# Patient Record
Sex: Female | Born: 1975 | Race: Black or African American | Hispanic: No | Marital: Single | Smoking: Never smoker
Health system: Southern US, Community
[De-identification: ages and names within clinical notes are randomized; demographics above are authoritative.]

## PROBLEM LIST (undated history)

## (undated) DIAGNOSIS — Z5189 Encounter for other specified aftercare: Secondary | ICD-10-CM

## (undated) DIAGNOSIS — I10 Essential (primary) hypertension: Secondary | ICD-10-CM

## (undated) DIAGNOSIS — D649 Anemia, unspecified: Secondary | ICD-10-CM

## (undated) HISTORY — DX: Anemia, unspecified: D64.9

## (undated) SURGERY — ESOPHAGOGASTRODUODENOSCOPY (EGD) WITH PROPOFOL
Anesthesia: Monitor Anesthesia Care

---

## 1998-04-30 ENCOUNTER — Ambulatory Visit (HOSPITAL_COMMUNITY): Admission: RE | Admit: 1998-04-30 | Discharge: 1998-04-30 | Payer: Self-pay | Admitting: *Deleted

## 1998-05-15 ENCOUNTER — Inpatient Hospital Stay (HOSPITAL_COMMUNITY): Admission: AD | Admit: 1998-05-15 | Discharge: 1998-05-15 | Payer: Self-pay | Admitting: *Deleted

## 1998-05-16 ENCOUNTER — Inpatient Hospital Stay (HOSPITAL_COMMUNITY): Admission: AD | Admit: 1998-05-16 | Discharge: 1998-05-16 | Payer: Self-pay | Admitting: Obstetrics

## 1998-07-30 ENCOUNTER — Other Ambulatory Visit: Admission: RE | Admit: 1998-07-30 | Discharge: 1998-07-30 | Payer: Self-pay | Admitting: Obstetrics

## 1998-08-20 ENCOUNTER — Ambulatory Visit (HOSPITAL_COMMUNITY): Admission: RE | Admit: 1998-08-20 | Discharge: 1998-08-20 | Payer: Self-pay | Admitting: *Deleted

## 1998-09-27 ENCOUNTER — Inpatient Hospital Stay (HOSPITAL_COMMUNITY): Admission: AD | Admit: 1998-09-27 | Discharge: 1998-09-30 | Payer: Self-pay | Admitting: Obstetrics & Gynecology

## 1998-11-15 ENCOUNTER — Other Ambulatory Visit: Admission: RE | Admit: 1998-11-15 | Discharge: 1998-11-15 | Payer: Self-pay | Admitting: General Surgery

## 1999-01-21 ENCOUNTER — Other Ambulatory Visit: Admission: RE | Admit: 1999-01-21 | Discharge: 1999-01-21 | Payer: Self-pay | Admitting: Obstetrics and Gynecology

## 1999-01-23 ENCOUNTER — Other Ambulatory Visit: Admission: RE | Admit: 1999-01-23 | Discharge: 1999-01-23 | Payer: Self-pay | Admitting: Obstetrics

## 1999-07-15 ENCOUNTER — Other Ambulatory Visit: Admission: RE | Admit: 1999-07-15 | Discharge: 1999-07-15 | Payer: Self-pay | Admitting: *Deleted

## 1999-07-15 ENCOUNTER — Encounter: Admission: RE | Admit: 1999-07-15 | Discharge: 1999-07-15 | Payer: Self-pay | Admitting: Obstetrics & Gynecology

## 1999-07-29 ENCOUNTER — Encounter: Admission: RE | Admit: 1999-07-29 | Discharge: 1999-07-29 | Payer: Self-pay | Admitting: Obstetrics & Gynecology

## 1999-08-03 ENCOUNTER — Inpatient Hospital Stay (HOSPITAL_COMMUNITY): Admission: AD | Admit: 1999-08-03 | Discharge: 1999-08-03 | Payer: Self-pay | Admitting: Obstetrics

## 1999-08-05 ENCOUNTER — Encounter: Admission: RE | Admit: 1999-08-05 | Discharge: 1999-08-05 | Payer: Self-pay | Admitting: Obstetrics & Gynecology

## 2000-03-09 ENCOUNTER — Ambulatory Visit (HOSPITAL_COMMUNITY): Admission: RE | Admit: 2000-03-09 | Discharge: 2000-03-09 | Payer: Self-pay | Admitting: *Deleted

## 2000-04-30 ENCOUNTER — Ambulatory Visit (HOSPITAL_COMMUNITY): Admission: RE | Admit: 2000-04-30 | Discharge: 2000-04-30 | Payer: Self-pay | Admitting: *Deleted

## 2000-06-11 ENCOUNTER — Ambulatory Visit (HOSPITAL_COMMUNITY): Admission: RE | Admit: 2000-06-11 | Discharge: 2000-06-11 | Payer: Self-pay | Admitting: *Deleted

## 2000-07-30 ENCOUNTER — Inpatient Hospital Stay (HOSPITAL_COMMUNITY): Admission: AD | Admit: 2000-07-30 | Discharge: 2000-07-30 | Payer: Self-pay | Admitting: *Deleted

## 2000-08-12 ENCOUNTER — Inpatient Hospital Stay (HOSPITAL_COMMUNITY): Admission: AD | Admit: 2000-08-12 | Discharge: 2000-08-12 | Payer: Self-pay | Admitting: *Deleted

## 2000-08-12 ENCOUNTER — Inpatient Hospital Stay (HOSPITAL_COMMUNITY): Admission: AD | Admit: 2000-08-12 | Discharge: 2000-08-14 | Payer: Self-pay | Admitting: Obstetrics & Gynecology

## 2000-12-29 ENCOUNTER — Emergency Department (HOSPITAL_COMMUNITY): Admission: EM | Admit: 2000-12-29 | Discharge: 2000-12-29 | Payer: Self-pay | Admitting: *Deleted

## 2002-01-30 ENCOUNTER — Encounter: Admission: RE | Admit: 2002-01-30 | Discharge: 2002-01-30 | Payer: Self-pay | Admitting: *Deleted

## 2002-03-10 ENCOUNTER — Emergency Department (HOSPITAL_COMMUNITY): Admission: EM | Admit: 2002-03-10 | Discharge: 2002-03-10 | Payer: Self-pay | Admitting: Emergency Medicine

## 2004-10-03 ENCOUNTER — Emergency Department (HOSPITAL_COMMUNITY): Admission: EM | Admit: 2004-10-03 | Discharge: 2004-10-03 | Payer: Self-pay | Admitting: Family Medicine

## 2006-03-25 ENCOUNTER — Emergency Department (HOSPITAL_COMMUNITY): Admission: AD | Admit: 2006-03-25 | Discharge: 2006-03-25 | Payer: Self-pay | Admitting: Family Medicine

## 2006-10-28 ENCOUNTER — Inpatient Hospital Stay (HOSPITAL_COMMUNITY): Admission: AD | Admit: 2006-10-28 | Discharge: 2006-10-28 | Payer: Self-pay | Admitting: Obstetrics and Gynecology

## 2006-11-20 ENCOUNTER — Inpatient Hospital Stay (HOSPITAL_COMMUNITY): Admission: AD | Admit: 2006-11-20 | Discharge: 2006-11-20 | Payer: Self-pay | Admitting: Obstetrics & Gynecology

## 2006-11-30 ENCOUNTER — Inpatient Hospital Stay (HOSPITAL_COMMUNITY): Admission: AD | Admit: 2006-11-30 | Discharge: 2006-12-03 | Payer: Self-pay | Admitting: Obstetrics and Gynecology

## 2007-07-08 ENCOUNTER — Emergency Department (HOSPITAL_COMMUNITY): Admission: EM | Admit: 2007-07-08 | Discharge: 2007-07-08 | Payer: Self-pay | Admitting: Family Medicine

## 2007-09-04 ENCOUNTER — Emergency Department (HOSPITAL_COMMUNITY): Admission: EM | Admit: 2007-09-04 | Discharge: 2007-09-04 | Payer: Self-pay | Admitting: Emergency Medicine

## 2007-09-06 ENCOUNTER — Emergency Department (HOSPITAL_COMMUNITY): Admission: EM | Admit: 2007-09-06 | Discharge: 2007-09-06 | Payer: Self-pay | Admitting: Family Medicine

## 2008-06-06 ENCOUNTER — Emergency Department (HOSPITAL_COMMUNITY): Admission: EM | Admit: 2008-06-06 | Discharge: 2008-06-06 | Payer: Self-pay | Admitting: Emergency Medicine

## 2008-09-11 ENCOUNTER — Ambulatory Visit (HOSPITAL_COMMUNITY): Admission: RE | Admit: 2008-09-11 | Discharge: 2008-09-11 | Payer: Self-pay | Admitting: Obstetrics

## 2009-01-26 ENCOUNTER — Ambulatory Visit (HOSPITAL_COMMUNITY): Admission: RE | Admit: 2009-01-26 | Discharge: 2009-01-26 | Payer: Self-pay | Admitting: Obstetrics & Gynecology

## 2009-01-27 ENCOUNTER — Inpatient Hospital Stay (HOSPITAL_COMMUNITY): Admission: AD | Admit: 2009-01-27 | Discharge: 2009-01-30 | Payer: Self-pay | Admitting: Obstetrics & Gynecology

## 2009-01-28 ENCOUNTER — Encounter: Payer: Self-pay | Admitting: Obstetrics & Gynecology

## 2009-04-23 ENCOUNTER — Ambulatory Visit (HOSPITAL_COMMUNITY): Admission: RE | Admit: 2009-04-23 | Discharge: 2009-04-23 | Payer: Self-pay | Admitting: Obstetrics & Gynecology

## 2009-06-27 ENCOUNTER — Emergency Department (HOSPITAL_COMMUNITY): Admission: EM | Admit: 2009-06-27 | Discharge: 2009-06-27 | Payer: Self-pay | Admitting: Family Medicine

## 2009-07-09 ENCOUNTER — Emergency Department (HOSPITAL_COMMUNITY): Admission: EM | Admit: 2009-07-09 | Discharge: 2009-07-09 | Payer: Self-pay | Admitting: Family Medicine

## 2009-07-15 ENCOUNTER — Ambulatory Visit: Payer: Self-pay | Admitting: Family Medicine

## 2009-07-29 ENCOUNTER — Ambulatory Visit: Payer: Self-pay | Admitting: Family Medicine

## 2009-08-12 ENCOUNTER — Ambulatory Visit (HOSPITAL_COMMUNITY): Admission: RE | Admit: 2009-08-12 | Discharge: 2009-08-12 | Payer: Self-pay | Admitting: Obstetrics & Gynecology

## 2009-08-12 ENCOUNTER — Ambulatory Visit: Payer: Self-pay | Admitting: Internal Medicine

## 2010-11-06 ENCOUNTER — Encounter: Payer: Self-pay | Admitting: Obstetrics & Gynecology

## 2011-01-23 LAB — CBC
Hemoglobin: 10.5 g/dL — ABNORMAL LOW (ref 12.0–15.0)
MCHC: 32.8 g/dL (ref 30.0–36.0)
MCV: 74.8 fL — ABNORMAL LOW (ref 78.0–100.0)
RDW: 16.5 % — ABNORMAL HIGH (ref 11.5–15.5)

## 2011-01-25 LAB — CBC
HCT: 18.7 % — ABNORMAL LOW (ref 36.0–46.0)
HCT: 32 % — ABNORMAL LOW (ref 36.0–46.0)
Hemoglobin: 10.9 g/dL — ABNORMAL LOW (ref 12.0–15.0)
MCHC: 33.8 g/dL (ref 30.0–36.0)
MCV: 88.2 fL (ref 78.0–100.0)
MCV: 87.9 fL (ref 78.0–100.0)
Platelets: 143 10*3/uL — ABNORMAL LOW (ref 150–400)
Platelets: 160 10*3/uL (ref 150–400)
Platelets: 159 10*3/uL (ref 150–400)
RBC: 2.2 MIL/uL — ABNORMAL LOW (ref 3.87–5.11)
RBC: 3.64 MIL/uL — ABNORMAL LOW (ref 3.87–5.11)
RDW: 13.9 % (ref 11.5–15.5)
WBC: 11.9 10*3/uL — ABNORMAL HIGH (ref 4.0–10.5)
WBC: 13.2 10*3/uL — ABNORMAL HIGH (ref 4.0–10.5)
WBC: 8 10*3/uL (ref 4.0–10.5)

## 2011-01-25 LAB — BASIC METABOLIC PANEL
BUN: 8 mg/dL (ref 6–23)
Calcium: 8.2 mg/dL — ABNORMAL LOW (ref 8.4–10.5)
Chloride: 107 mEq/L (ref 96–112)
Creatinine, Ser: 0.57 mg/dL (ref 0.4–1.2)
GFR calc Af Amer: >60 mL/min (ref >60)
GFR calc non Af Amer: >60 mL/min (ref >60)

## 2011-01-25 LAB — COMPREHENSIVE METABOLIC PANEL
Albumin: 2.6 g/dL — ABNORMAL LOW (ref 3.5–5.2)
Alkaline Phosphatase: 171 U/L — ABNORMAL HIGH (ref 39–117)
BUN: 7 mg/dL (ref 6–23)
CO2: 22 mEq/L (ref 19–32)
Chloride: 105 mEq/L (ref 96–112)
Creatinine, Ser: 0.62 mg/dL (ref 0.4–1.2)
GFR calc non Af Amer: >60 mL/min (ref >60)
Potassium: 4 mEq/L (ref 3.5–5.1)
Total Bilirubin: 0.2 mg/dL — ABNORMAL LOW (ref 0.3–1.2)

## 2011-01-25 LAB — URIC ACID: Uric Acid, Serum: 5.2 mg/dL (ref 2.4–7.0)

## 2011-01-25 LAB — RPR: RPR Ser Ql: NONREACTIVE

## 2011-02-16 ENCOUNTER — Ambulatory Visit: Payer: Self-pay | Admitting: Internal Medicine

## 2011-02-16 DIAGNOSIS — Z0289 Encounter for other administrative examinations: Secondary | ICD-10-CM

## 2011-02-28 NOTE — Op Note (Signed)
NAMEWAYNE, Castillo                  ACCOUNT NO.:  [id number]   MEDICAL RECORD NO.:  [id number]          PATIENT TYPE:  AMB   LOCATION:  SDC                           FACILITY:  [facility]   PHYSICIAN:  Roseanna Rainbow, M.D.DATE OF BIRTH:  [birth year]   DATE OF PROCEDURE:  DATE OF DISCHARGE:  04/23/2009                               OPERATIVE REPORT   PREOPERATIVE DIAGNOSIS:  Multiparity, desires a sterilization procedure.   POSTOPERATIVE DIAGNOSIS:  Multiparity, desires a sterilization  procedure.   PROCEDURE:  Essure procedure.   SURGEON:  Roseanna Rainbow, MD   ANESTHESIA:  Laryngeal mask airway.   FINDINGS:  Upon hysteroscopic survey, there were no lesions noted.  The  endometrium was thin.  After the Essure devices were placed, there were  3 coils distal to the tubal ostia bilaterally.   PATHOLOGY:  None.   ESTIMATED BLOOD LOSS:  Minimal.   COMPLICATIONS:  None.   PROCEDURE:  The patient was taken to the operating room with an IV  running.  A laryngeal mask airway was placed.  She was placed in the  dorsal lithotomy position and prepped and draped in the usual sterile  fashion.  After time-out had been completed, a sterile speculum was  placed in the patient's vagina.  The anterior lip of the cervix was  grasped with a single-tooth tenaculum.  Hysteroscope was then introduced  into the cervix and advanced into the uterus.  The tubal ostia were well  visualized bilaterally.  The right tube was cannulated first.  The  device was placed without difficulty.  The left tube was cannulated and  the device placed in a similar fashion on the left.  All instruments  were then removed.  The single-tooth tenaculum was removed from the  cervix with minimal bleeding noted.  At the close of the procedure, the  instrument and pack counts were said to be correct x2.  The patient was  taken to the PACU awake and in stable condition.      Roseanna Rainbow, M.D.  Electronically Signed     LAJ/MEDQ  D:  04/23/2009  T:  04/24/2009  Job:  [id number]

## 2011-02-28 NOTE — H&P (Signed)
Faith Castillo, Faith Castillo                  ACCOUNT NO.:  [id number]   MEDICAL RECORD NO.:  [id number]          PATIENT TYPE:  INP   LOCATION:  9371                          FACILITY:  [facility]   PHYSICIAN:  Roseanna Rainbow, M.D.DATE OF BIRTH:  1976-04-28   DATE OF ADMISSION:  01/27/2009  DATE OF DISCHARGE:                              HISTORY & PHYSICAL   CHIEF COMPLAINT:  The patient is a 35 year old para 4 with an estimated  date of confinement of February 12, 2009, with an intrauterine pregnancy at  37.5 weeks with elevated blood pressures.   HISTORY OF PRESENT ILLNESS:  The patient does not have any history of  chronic hypertension or pregnancy-induced hypertension.  However, over  the last several weeks, there had been elevated blood pressures noted at  the prenatal visits in the 140/80 range.  She has denied any  neurological symptoms.  She has also not had any significant proteinuria  on her urine dip.  On presentation to the office earlier today, blood  pressures were labile with diastolics in the 100 range.  The urine dip  showed 1+ protein.  A non-stress test was reassuring.  A BPP one day  prior to presentation was 8/8 with a cephalic presentation.  On exam in  the office, she was 2 cm dilated, 60% effaced.  A PIH panel from January 21, 2009, was remarkable for uric acid of 6.1.   ALLERGIES:  No known drug allergies.   MEDICATIONS:  Prenatal vitamins.   OB RISK FACTORS:  Grand multiparity, urinary tract infection.   PRENATAL LABORATORIES:  Chlamydia probe negative.  Urine culture and  sensitivity, insignificant growth.  February 2010 and November 2009  Staphylococcus coagulase negative.  Pap smear negative.  GC probe  negative.  Hepatitis B surface antigen negative.  One-hour GTT 107,  hematocrit 34, hemoglobin 11.  HIV nonreactive.  Platelets 205,000.  Blood type is A+.  Antibody screen negative.  RPR nonreactive.  Rubella  immune.  Sickle cell negative.  GBS negative on January 13, 2009.   PAST OBSTETRICAL HISTORY:  There is a history of 2 voluntary  terminations of pregnancy.  In August 1994, she was delivered of a live  born female full term, 6 pounds 15 ounces, vaginal delivery, no  complications.  In December 1999, she was delivered of a live born female  full term, 7 pounds 7 ounces, vaginal delivery at 30 weeks' gestation.  In October 2001, she was delivered of a live born female full term,  vaginal delivery 8 pounds, no complications.  In February 2008, she was  delivered of a live born female full term, 8 pounds 13 ounces, vaginal  delivery, no complications.   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  She is  status post a LEEP procedure.   PAST MEDICAL HISTORY:  No significant history of medical diseases.   PAST SURGICAL HISTORY:  Please see the above.   REVIEW OF SYSTEMS:  NEUROLOGICAL:  She denies any headache or visual  disturbances.  GI:  She denies any epigastric  pain.  PULMONARY:  She  denies any shortness of breath.   SOCIAL HISTORY:  She is a Lawyer.  She is single living with the  significant other.  Does not give any significant history of alcohol  usage, has no significant smoking history, denies illicit drug use.   FAMILY HISTORY:  Remarkable for hypertension.   PHYSICAL EXAMINATION:  Blood pressures 150s to 170s over 90s to 100s and  110s.  Fetal heart tracing reassuring.  Tocodynamometer, rare uterine  contractions.  Sterile vaginal exam, please see the above.  Extremities,  1+ lower extremity edema.   LABS ON ADMISSION:  Hemoglobin 10.9, platelets 159,000.  Uric acid 5.2,  LDH 232, creatinine 0.62.  SGOT and SGPT 26 and 15 respectively.   ASSESSMENT:  Multipara at 37.5 weeks with a blood pressure criteria for  severe pregnancy-induced hypertension, favorable Bishop score.  Fetal  heart tracing consistent with fetal well being.   PLAN:  This patient was reviewed with Dr. Katrinka Blazing at the [facility].  The  recommendation was to proceed with induction of labor.  In  light of the labile blood pressures, we will start magnesium sulfate for  seizure prophylaxis.  We will also administer labetalol parenterally as  necessary.  We will also start on a p.o. labetalol regimen for blood  pressure control.  We will start with low-dose Pitocin per protocol  along with artificial rupture of membranes.      Roseanna Rainbow, M.D.  Electronically Signed     LAJ/MEDQ  D:  01/27/2009  T:  01/28/2009  Job:  [id number]

## 2011-07-25 LAB — WOUND CULTURE

## 2011-07-27 LAB — POCT URINALYSIS DIP (DEVICE)
Glucose, UA: NEGATIVE
Urobilinogen, UA: 0.2
pH: 6

## 2011-07-27 LAB — POCT PREGNANCY, URINE
Operator id: 116391
Preg Test, Ur: NEGATIVE

## 2011-07-27 LAB — WET PREP, GENITAL
Trich, Wet Prep: NONE SEEN
Yeast Wet Prep HPF POC: NONE SEEN

## 2011-07-27 LAB — GC/CHLAMYDIA PROBE AMP, GENITAL: GC Probe Amp, Genital: NEGATIVE

## 2012-03-25 ENCOUNTER — Other Ambulatory Visit: Payer: Self-pay | Admitting: Occupational Medicine

## 2012-03-25 ENCOUNTER — Ambulatory Visit: Payer: Self-pay

## 2012-03-25 DIAGNOSIS — R52 Pain, unspecified: Secondary | ICD-10-CM

## 2013-07-23 ENCOUNTER — Emergency Department (INDEPENDENT_AMBULATORY_CARE_PROVIDER_SITE_OTHER)
Admission: EM | Admit: 2013-07-23 | Discharge: 2013-07-23 | Disposition: A | Discharge status: Home / Self Care | Payer: Self-pay | Source: Home / Self Care | Attending: Emergency Medicine | Admitting: Emergency Medicine

## 2013-07-23 ENCOUNTER — Other Ambulatory Visit (HOSPITAL_COMMUNITY)
Admission: RE | Admit: 2013-07-23 | Discharge: 2013-07-23 | Disposition: A | Discharge status: Home / Self Care | Payer: Self-pay | Source: Ambulatory Visit | Attending: Emergency Medicine | Admitting: Emergency Medicine

## 2013-07-23 DIAGNOSIS — N760 Acute vaginitis: Secondary | ICD-10-CM | POA: Insufficient documentation

## 2013-07-23 DIAGNOSIS — Z113 Encounter for screening for infections with a predominantly sexual mode of transmission: Secondary | ICD-10-CM | POA: Insufficient documentation

## 2013-07-23 DIAGNOSIS — N949 Unspecified condition associated with female genital organs and menstrual cycle: Secondary | ICD-10-CM

## 2013-07-23 DIAGNOSIS — N938 Other specified abnormal uterine and vaginal bleeding: Secondary | ICD-10-CM

## 2013-07-23 LAB — POCT URINALYSIS DIP (DEVICE)
Bilirubin Urine: NEGATIVE
Nitrite: POSITIVE — AB
Protein, ur: NEGATIVE mg/dL
Urobilinogen, UA: 0.2 mg/dL (ref 0.0–1.0)
pH: 6.5 (ref 5.0–8.0)

## 2013-07-23 LAB — POCT PREGNANCY, URINE: Preg Test, Ur: NEGATIVE

## 2013-07-23 MED ORDER — MEDROXYPROGESTERONE ACETATE 10 MG PO TABS: 10.0000 mg | ORAL_TABLET | Freq: Every day | ORAL | Status: DC

## 2013-07-23 NOTE — ED Provider Notes (Signed)
Chief Complaint:   Chief Complaint  Patient presents with  . Vaginal Bleeding    History of Present Illness:   Faith Castillo is a 37 year old female who comes in today because of a regular vaginal bleeding. She had her normal menses from September 10 2 September 17 she states she has a heavy menstrual flow, but this was nothing unusual for her. She did not have any clotting or cramping. She began to bleed again this past Sunday which was 4 days ago. This was similar to a normal menstrual flow but with clots. She denies any pain or cramping. She's had some headache but denies fever, chills, nausea, or vomiting. She denies any urinary symptoms. She's had no vaginal discharge or itching. She denies any pelvic or lower back pain. The patient is married, she is sexually active, and has a tubal ligation. The patient states she had something very similar a couple of years ago and was given a prescription for Provera which did seem to help the situation.  Review of Systems:  Other than noted above, the patient denies any of the following symptoms: Systemic:  No fever, chills, sweats, or weight loss. GI:  No abdominal pain, nausea, anorexia, vomiting, diarrhea, constipation, melena or hematochezia. GU:  No dysuria, frequency, urgency, hematuria, vaginal discharge, itching, or abnormal vaginal bleeding. Skin:  No rash or itching.  PMFSH:  Past medical history, family history, social history, meds, and allergies were reviewed.  She has high blood pressure and takes medication for that, although she did not take her medication today.  Physical Exam:   Vital signs:  BP 151/101  Pulse 66  Temp(Src) 98.7 F (37.1 C) (Oral)  Resp 16  SpO2 100% General:  Alert, oriented and in no distress. Lungs:  Breath sounds clear and equal bilaterally.  No wheezes, rales or rhonchi. Heart:  Regular rhythm.  No gallops or murmers. Abdomen:  Soft, flat and non-distended.  No organomegaly or mass.  No tenderness, guarding  or rebound.  Bowel sounds normally active. Pelvic exam:  Normal external genitalia, vaginal and cervical mucosa were normal, no vaginal discharge, and only minimal vaginal bleeding. There was no pain on cervical motion. Uterus was midposition, normal in size and shape, and nontender. No adnexal masses or tenderness. DNA probe for gonorrhea, Chlamydia, Trichomonas, Gardnerella, Candida were obtained. Skin:  Clear, warm and dry.  Labs:   Results for orders placed during the hospital encounter of 07/23/13  POCT URINALYSIS DIP (DEVICE)      Result Value Range   Glucose, UA NEGATIVE  NEGATIVE mg/dL   Bilirubin Urine NEGATIVE  NEGATIVE   Ketones, ur NEGATIVE  NEGATIVE mg/dL   Specific Gravity, Urine >=1.030  1.005 - 1.030   Hgb urine dipstick SMALL (*) NEGATIVE   pH 6.5  5.0 - 8.0   Protein, ur NEGATIVE  NEGATIVE mg/dL   Urobilinogen, UA 0.2  0.0 - 1.0 mg/dL   Nitrite POSITIVE (*) NEGATIVE   Leukocytes, UA TRACE (*) NEGATIVE  POCT PREGNANCY, URINE      Result Value Range   Preg Test, Ur NEGATIVE  NEGATIVE     Assessment:  The encounter diagnosis was Dysfunctional uterine bleeding.  No evidence for pregnancy, infection, or tumor as a cause for the bleeding. Most likely due to hormone imbalance. Was provided with a prescription for Provera, and if this is a recurring problem, suggested followup with a gynecologist at the [facility].  Plan:   1.  Meds:  The following  meds were prescribed:   Discharge Medication List as of 07/23/2013  7:15 PM    START taking these medications   Details  medroxyPROGESTERone (PROVERA) 10 MG tablet Take 1 tablet (10 mg total) by mouth daily., Starting 07/23/2013, Until Discontinued, Normal        2.  Patient Education/Counseling:  The patient was given appropriate handouts, self care instructions, and instructed in symptomatic relief.   3.  Follow up:  The patient was told to follow up if no better in 3 to 4 days, if becoming worse in any way,  and given some red flag symptoms such as any degree of pain or heavier bleeding which would prompt immediate return.  Follow up at [facility] if symptoms are recurring.Reuben Likes, MD 07/23/13 [phone]

## 2013-07-23 NOTE — ED Notes (Signed)
C/o vaginal bleeding onset 9/28. LMP 9/10 and lasted 7 days.  C/o slight headache. BP elevated and admits to not taking her BP pill today.

## 2013-07-25 NOTE — ED Notes (Addendum)
GC/Chlamydia neg., Affirm: Candida and Trich neg., Gardnerella pos. Message sent to Dr. Lorenz Coaster.  He wrote that pt. did not have any symptoms so, it does not need to be treated. Faith Castillo 07/25/2013

## 2013-07-26 ENCOUNTER — Encounter (HOSPITAL_COMMUNITY): Payer: Self-pay | Admitting: Emergency Medicine

## 2015-02-26 ENCOUNTER — Encounter (HOSPITAL_COMMUNITY): Payer: Self-pay | Admitting: Family Medicine

## 2015-02-26 ENCOUNTER — Emergency Department (INDEPENDENT_AMBULATORY_CARE_PROVIDER_SITE_OTHER)
Admission: EM | Admit: 2015-02-26 | Discharge: 2015-02-26 | Disposition: A | Discharge status: Home / Self Care | Payer: Self-pay | Source: Home / Self Care | Attending: Family Medicine | Admitting: Family Medicine

## 2015-02-26 ENCOUNTER — Ambulatory Visit (HOSPITAL_COMMUNITY): Payer: No Typology Code available for payment source | Attending: Family Medicine

## 2015-02-26 ENCOUNTER — Ambulatory Visit: Payer: PRIVATE HEALTH INSURANCE

## 2015-02-26 DIAGNOSIS — M25522 Pain in left elbow: Secondary | ICD-10-CM | POA: Diagnosis present

## 2015-02-26 DIAGNOSIS — M25512 Pain in left shoulder: Secondary | ICD-10-CM | POA: Insufficient documentation

## 2015-02-26 DIAGNOSIS — M62838 Other muscle spasm: Secondary | ICD-10-CM

## 2015-02-26 DIAGNOSIS — S40022A Contusion of left upper arm, initial encounter: Secondary | ICD-10-CM

## 2015-02-26 HISTORY — DX: Essential (primary) hypertension: I10

## 2015-02-26 MED ORDER — IBUPROFEN 800 MG PO TABS
800.0000 mg | ORAL_TABLET | Freq: Once | ORAL | Status: AC
  Administered 2015-02-26: 800 mg via ORAL

## 2015-02-26 MED ORDER — METHOCARBAMOL 500 MG PO TABS: 500.0000 mg | ORAL_TABLET | Freq: Four times a day (QID) | ORAL | Status: DC | PRN

## 2015-02-26 MED ORDER — IBUPROFEN 800 MG PO TABS
ORAL_TABLET | ORAL | Status: AC
  Filled 2015-02-26: qty 1

## 2015-02-26 MED ORDER — DICLOFENAC SODIUM 75 MG PO TBEC: 75.0000 mg | DELAYED_RELEASE_TABLET | Freq: Two times a day (BID) | ORAL | Status: DC
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
## 2016-02-24 ENCOUNTER — Emergency Department (HOSPITAL_COMMUNITY)
Admission: EM | Admit: 2016-02-24 | Discharge: 2016-02-25 | Disposition: A | Discharge status: Home / Self Care | Payer: No Typology Code available for payment source | Attending: Emergency Medicine | Admitting: Emergency Medicine

## 2016-02-24 DIAGNOSIS — Z79899 Other long term (current) drug therapy: Secondary | ICD-10-CM | POA: Insufficient documentation

## 2016-02-24 DIAGNOSIS — N760 Acute vaginitis: Secondary | ICD-10-CM | POA: Insufficient documentation

## 2016-02-24 DIAGNOSIS — A599 Trichomoniasis, unspecified: Secondary | ICD-10-CM

## 2016-02-24 DIAGNOSIS — I10 Essential (primary) hypertension: Secondary | ICD-10-CM | POA: Insufficient documentation

## 2016-02-24 DIAGNOSIS — B9689 Other specified bacterial agents as the cause of diseases classified elsewhere: Secondary | ICD-10-CM | POA: Insufficient documentation

## 2016-02-25 ENCOUNTER — Encounter (HOSPITAL_COMMUNITY): Payer: Self-pay | Admitting: Emergency Medicine

## 2016-02-25 LAB — WET PREP, GENITAL
Sperm: NONE SEEN
Yeast Wet Prep HPF POC: NONE SEEN

## 2016-02-25 MED ORDER — ONDANSETRON 4 MG PO TBDP
4.0000 mg | ORAL_TABLET | Freq: Once | ORAL | Status: AC
  Administered 2016-02-25: 4 mg via ORAL
  Filled 2016-02-25: qty 1

## 2016-02-25 MED ORDER — AZITHROMYCIN 250 MG PO TABS
1000.0000 mg | ORAL_TABLET | Freq: Once | ORAL | Status: AC
  Administered 2016-02-25: 1000 mg via ORAL
  Filled 2016-02-25: qty 4

## 2016-02-25 MED ORDER — CEFTRIAXONE SODIUM 250 MG IJ SOLR
250.0000 mg | Freq: Once | INTRAMUSCULAR | Status: AC
  Administered 2016-02-25: 250 mg via INTRAMUSCULAR
  Filled 2016-02-25: qty 250

## 2016-02-25 MED ORDER — STERILE WATER FOR INJECTION IJ SOLN
INTRAMUSCULAR | Status: AC
  Administered 2016-02-25: 10 mL
  Filled 2016-02-25: qty 10

## 2016-02-25 MED ORDER — METRONIDAZOLE 500 MG PO TABS
2000.0000 mg | ORAL_TABLET | Freq: Once | ORAL | Status: AC
  Administered 2016-02-25: 2000 mg via ORAL
  Filled 2016-02-25: qty 4

## 2016-02-25 NOTE — Discharge Instructions (Signed)
Bacterial Vaginosis Bacterial vaginosis is a vaginal infection that occurs when the normal balance of bacteria in the vagina is disrupted. It results from an overgrowth of certain bacteria. This is the most common vaginal infection in women of childbearing age. Treatment is important to prevent complications, especially in pregnant women, as it can cause a premature delivery. CAUSES  Bacterial vaginosis is caused by an increase in harmful bacteria that are normally present in smaller amounts in the vagina. Several different kinds of bacteria can cause bacterial vaginosis. However, the reason that the condition develops is not fully understood. RISK FACTORS Certain activities or behaviors can put you at an increased risk of developing bacterial vaginosis, including:  Having a new sex partner or multiple sex partners.  Douching.  Using an intrauterine device (IUD) for contraception. Women do not get bacterial vaginosis from toilet seats, bedding, swimming pools, or contact with objects around them. SIGNS AND SYMPTOMS  Some women with bacterial vaginosis have no signs or symptoms. Common symptoms include:  Grey vaginal discharge.  A fishlike odor with discharge, especially after sexual intercourse.  Itching or burning of the vagina and vulva.  Burning or pain with urination. DIAGNOSIS  Your health care provider will take a medical history and examine the vagina for signs of bacterial vaginosis. A sample of vaginal fluid may be taken. Your health care provider will look at this sample under a microscope to check for bacteria and abnormal cells. A vaginal pH test may also be done.  TREATMENT  Bacterial vaginosis may be treated with antibiotic medicines. These may be given in the form of a pill or a vaginal cream. A second round of antibiotics may be prescribed if the condition comes back after treatment. Because bacterial vaginosis increases your risk for sexually transmitted diseases, getting  treated can help reduce your risk for chlamydia, gonorrhea, HIV, and herpes. HOME CARE INSTRUCTIONS   Only take over-the-counter or prescription medicines as directed by your health care provider.  If antibiotic medicine was prescribed, take it as directed. Make sure you finish it even if you start to feel better.  Tell all sexual partners that you have a vaginal infection. They should see their health care provider and be treated if they have problems, such as a mild rash or itching.  During treatment, it is important that you follow these instructions:  Avoid sexual activity or use condoms correctly.  Do not douche.  Avoid alcohol as directed by your health care provider.  Avoid breastfeeding as directed by your health care provider. SEEK MEDICAL CARE IF:   Your symptoms are not improving after 3 days of treatment.  You have increased discharge or pain.  You have a fever. MAKE SURE YOU:   Understand these instructions.  Will watch your condition.  Will get help right away if you are not doing well or get worse. FOR MORE INFORMATION  Centers for Disease Control and Prevention, Division of STD Prevention: www.cdc.gov/std American Sexual Health Association (ASHA): www.ashastd.org    This information is not intended to replace advice given to you by your health care provider. Make sure you discuss any questions you have with your health care provider.   Document Released: 10/02/2005 Document Revised: 10/23/2014 Document Reviewed: 05/14/2013 Elsevier Interactive Patient Education 2016 Elsevier Inc. Trichomoniasis Trichomoniasis is an infection caused by an organism called Trichomonas. The infection can affect both women and men. In women, the outer female genitalia and the vagina are affected. In men, the penis is mainly affected,   but the prostate and other reproductive organs can also be involved. Trichomoniasis is a sexually transmitted infection (STI) and is most often  passed to another person through sexual contact.  RISK FACTORS  Having unprotected sexual intercourse.  Having sexual intercourse with an infected partner. SIGNS AND SYMPTOMS  Symptoms of trichomoniasis in women include:  Abnormal gray-green frothy vaginal discharge.  Itching and irritation of the vagina.  Itching and irritation of the area outside the vagina. Symptoms of trichomoniasis in men include:   Penile discharge with or without pain.  Pain during urination. This results from inflammation of the urethra. DIAGNOSIS  Trichomoniasis may be found during a Pap test or physical exam. Your health care provider may use one of the following methods to help diagnose this infection:  Testing the pH of the vagina with a test tape.  Using a vaginal swab test that checks for the Trichomonas organism. A test is available that provides results within a few minutes.  Examining a urine sample.  Testing vaginal secretions. Your health care provider may test you for other STIs, including HIV. TREATMENT   You may be given medicine to fight the infection. Women should inform their health care provider if they could be or are pregnant. Some medicines used to treat the infection should not be taken during pregnancy.  Your health care provider may recommend over-the-counter medicines or creams to decrease itching or irritation.  Your sexual partner will need to be treated if infected.  Your health care provider may test you for infection again 3 months after treatment. HOME CARE INSTRUCTIONS   Take medicines only as directed by your health care provider.  Take over-the-counter medicine for itching or irritation as directed by your health care provider.  Do not have sexual intercourse while you have the infection.  Women should not douche or wear tampons while they have the infection.  Discuss your infection with your partner. Your partner may have gotten the infection from you, or you  may have gotten it from your partner.  Have your sex partner get examined and treated if necessary.  Practice safe, informed, and protected sex.  See your health care provider for other STI testing. SEEK MEDICAL CARE IF:   You still have symptoms after you finish your medicine.  You develop abdominal pain.  You have pain when you urinate.  You have bleeding after sexual intercourse.  You develop a rash.  Your medicine makes you sick or makes you throw up (vomit). MAKE SURE YOU:  Understand these instructions.  Will watch your condition.  Will get help right away if you are not doing well or get worse.   This information is not intended to replace advice given to you by your health care provider. Make sure you discuss any questions you have with your health care provider.   Document Released: 03/28/2001 Document Revised: 10/23/2014 Document Reviewed: 07/14/2013 Elsevier Interactive Patient Education 2016 Elsevier Inc.  

## 2016-02-25 NOTE — ED Notes (Signed)
Cart at the bedside.

## 2016-02-25 NOTE — ED Notes (Signed)
Pt states she has a really bad yeast infection for the past two weeks  Pt states she has been using monostat combo pack without relief  Pt states she has clear to white vaginal discharge with irritation and itching

## 2016-02-25 NOTE — ED Provider Notes (Signed)
CSN: [id number]     Arrival date & time 02/24/16  2358 History   First MD Initiated Contact with Patient 02/25/16 0151     Chief Complaint  Patient presents with  . Vaginal Discharge     (Consider location/radiation/quality/duration/timing/severity/associated sxs/prior Treatment) HPI   Patient has PMH of hypertension and that presents with vaginal discharge over the past two weeks,  Has been using Monostat over the counter suppositories OTC. She feels like the first time she used it it helped some but then her symptoms came right back. She is having associated irritation and itching and a mix between white and clear. No fever, back pain, N/V/D, abdominal pains.  Past Medical History  Diagnosis Date  . Hypertension    History reviewed. No pertinent past surgical history. Family History  Problem Relation Age of Onset  . Hypertension Mother   . Hypertension Father    Social History  Substance Use Topics  . Smoking status: Never Smoker   . Smokeless tobacco: None  . Alcohol Use: No   OB History    No data available     Review of Systems  Review of Systems All other systems negative except as documented in the HPI. All pertinent positives and negatives as reviewed in the HPI.   Allergies  Review of patient's allergies indicates no known allergies.  Home Medications   Prior to Admission medications   Medication Sig Start Date End Date Taking? Authorizing Provider  atenolol-chlorthalidone (TENORETIC) 50-25 MG tablet Take 1 tablet by mouth daily.   Yes Historical Provider, MD  Cyanocobalamin (B-12 PO) Take 1 tablet by mouth daily.   Yes Historical Provider, MD  Miconazole Nitrate (MONISTAT 3 COMBINATION PACK VA) Place 1 tablet vaginally daily.   Yes Historical Provider, MD   BP 138/77 mmHg  Pulse 70  Temp(Src) 98.1 F (36.7 C) (Oral)  Resp 20  Ht  (1.676 m)  Wt 97.523 kg  BMI 34.72 kg/m2  SpO2 97%  LMP 01/24/2016 (Exact Date) Physical Exam  Constitutional:  She appears well-developed and well-nourished. No distress.  HENT:  Head: Normocephalic and atraumatic.  Right Ear: Tympanic membrane and ear canal normal.  Left Ear: Tympanic membrane and ear canal normal.  Nose: Nose normal.  Mouth/Throat: Uvula is midline, oropharynx is clear and moist and mucous membranes are normal.  Eyes: Pupils are equal, round, and reactive to light.  Neck: Normal range of motion. Neck supple.  Cardiovascular: Normal rate and regular rhythm.   Pulmonary/Chest: Effort normal.  Abdominal: Soft.  No signs of abdominal distention  Genitourinary: Uterus normal. There is no rash or tenderness on the right labia. There is no rash or tenderness on the left labia. Cervix exhibits no motion tenderness, no discharge and no friability. Right adnexum displays no mass, no tenderness and no fullness. Left adnexum displays no mass, no tenderness and no fullness. No tenderness or bleeding in the vagina. No foreign body around the vagina. Vaginal discharge found.  Musculoskeletal:  No LE swelling  Neurological: She is alert.  Acting at baseline  Skin: Skin is warm and dry. No rash noted.  Nursing note and vitals reviewed.   ED Course  Procedures (including critical care time) Labs Review Labs Reviewed  WET PREP, GENITAL - Abnormal; Notable for the following:    Trich, Wet Prep PRESENT (*)    Clue Cells Wet Prep HPF POC PRESENT (*)    WBC, Wet Prep HPF POC MANY (*)    All other components within  normal limits  GC/CHLAMYDIA PROBE AMP ([place]    Imaging Review No results found. I have personally reviewed and evaluated these images and lab results as part of my medical decision-making.   EKG Interpretation None      MDM   Final diagnoses:  Trichomoniasis  BV (bacterial vaginosis)   3:00 am Wet prep pending  Wet prep shows trichomoniasis and clue cells. She also has many white blood cells.  Medications  metroNIDAZOLE (FLAGYL) tablet 2,000 mg  (2,000 mg Oral Given 02/25/16 0416)  ondansetron (ZOFRAN-ODT) disintegrating tablet 4 mg (4 mg Oral Given 02/25/16 0418)  azithromycin (ZITHROMAX) tablet 1,000 mg (1,000 mg Oral Given 02/25/16 0417)  cefTRIAXone (ROCEPHIN) injection 250 mg (250 mg Intramuscular Given 02/25/16 0418)  sterile water (preservative free) injection (10 mLs  Given 02/25/16 0418)   Systemic symptoms, abdominal pain. Advised to practice safe sex and have all partners evaluated and treated at the local health department. Also advised to follow with the health Department in 1-2 weeks to confirm effectiveness of treatment and receive additional education/evaluation. Return precautions given.     Marlon Peliffany Blinda Turek, PA-C 02/25/16 0424  Dione Boozeavid Glick, MD 02/25/16 [phone]

## 2016-02-28 LAB — GC/CHLAMYDIA PROBE AMP (~~LOC~~) NOT AT ARMC
CHLAMYDIA, DNA PROBE: NEGATIVE
NEISSERIA GONORRHEA: NEGATIVE
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
## 2018-06-16 ENCOUNTER — Ambulatory Visit (HOSPITAL_COMMUNITY)
Admission: EM | Admit: 2018-06-16 | Discharge: 2018-06-16 | Disposition: A | Discharge status: Home / Self Care | Payer: Self-pay | Attending: Family Medicine | Admitting: Family Medicine

## 2018-06-16 ENCOUNTER — Other Ambulatory Visit: Payer: Self-pay

## 2018-06-16 ENCOUNTER — Encounter (HOSPITAL_COMMUNITY): Payer: Self-pay

## 2018-06-16 DIAGNOSIS — I10 Essential (primary) hypertension: Secondary | ICD-10-CM | POA: Insufficient documentation

## 2018-06-16 DIAGNOSIS — N898 Other specified noninflammatory disorders of vagina: Secondary | ICD-10-CM | POA: Insufficient documentation

## 2018-06-16 DIAGNOSIS — G8929 Other chronic pain: Secondary | ICD-10-CM | POA: Insufficient documentation

## 2018-06-16 DIAGNOSIS — M545 Low back pain: Secondary | ICD-10-CM | POA: Insufficient documentation

## 2018-06-16 DIAGNOSIS — N760 Acute vaginitis: Secondary | ICD-10-CM

## 2018-06-16 DIAGNOSIS — R8271 Bacteriuria: Secondary | ICD-10-CM

## 2018-06-16 LAB — POCT URINALYSIS DIP (DEVICE)
Bilirubin Urine: NEGATIVE
Glucose, UA: NEGATIVE mg/dL
Ketones, ur: NEGATIVE mg/dL
NITRITE: POSITIVE — AB
PROTEIN: 30 mg/dL — AB
Urobilinogen, UA: 0.2 mg/dL (ref 0.0–1.0)
pH: 6 (ref 5.0–8.0)

## 2018-06-16 MED ORDER — NITROFURANTOIN MONOHYD MACRO 100 MG PO CAPS: 100.0000 mg | ORAL_CAPSULE | Freq: Two times a day (BID) | ORAL | 0 refills | Status: DC

## 2018-06-16 MED ORDER — METRONIDAZOLE 500 MG PO TABS: 500.0000 mg | ORAL_TABLET | Freq: Two times a day (BID) | ORAL | 0 refills | Status: DC

## 2018-06-16 MED ORDER — FLUCONAZOLE 150 MG PO TABS: 150.0000 mg | ORAL_TABLET | Freq: Once | ORAL | 0 refills | Status: AC

## 2018-06-16 NOTE — ED Triage Notes (Signed)
Pt resents to [facility] for vaginal discharge x1 week. Pt states discharge is white in color. Pt complains of odor, and itching.

## 2018-06-16 NOTE — ED Provider Notes (Signed)
[facility]    CSN: [id number] Arrival date & time: 06/16/18  1621     History   Chief Complaint Chief Complaint  Patient presents with  . Vaginal Discharge    HPI Faith Castillo is a 42 y.o. female.   This 42 year old woman presents with vaginal discharge x1 week. Pt states discharge is white in color. Pt complains of odor, and itching.   Patient has had an E sure procedure to prevent pregnancy over 5 years ago.  She still having periods.  Patient is uncertain whether she could have an STD.  She has had trichomonas in the past.  She has no new partners.  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  She has no increasing flank pain, abdominal pain, nausea, vomiting, fever, chills, or other signs of serious illness.  She has had some visible blood in her urine.     Past Medical History:  Diagnosis Date  . Hypertension     There are no active problems to display for this patient.   History reviewed. No pertinent surgical history.  OB History   None      Home Medications    Prior to Admission medications   Medication Sig Start Date End Date Taking? Authorizing Provider  atenolol-chlorthalidone (TENORETIC) 50-25 MG tablet Take 1 tablet by mouth daily.   Yes [provider]  Cyanocobalamin (B-12 PO) Take 1 tablet by mouth daily.   Yes [provider]  Miconazole Nitrate (MONISTAT 3 COMBINATION PACK VA) Place 1 tablet vaginally daily.    [provider]    Family History Family History  Problem Relation Age of Onset  . Hypertension Mother   . Hypertension Father     Social History Social History   Tobacco Use  . Smoking status: Never Smoker  . Smokeless tobacco: Never Used  Substance Use Topics  . Alcohol use: No  . Drug use: No     Allergies   Patient has no known allergies.   Review of Systems Review of Systems   Physical Exam Triage Vital Signs ED Triage Vitals  [06/16/18 1658]  Enc Vitals Group     BP 109/64     Pulse Rate 62     Resp 16     Temp 98.7 F (37.1 C)     Temp Source Oral     SpO2 100 %     Weight      Height      Head Circumference      Peak Flow      Pain Score 0     Pain Loc      Pain Edu?      Excl. in GC?    No data found.  Updated Vital Signs BP 109/64 (BP Location: Left Arm)   Pulse 62   Temp 98.7 F (37.1 C) (Oral)   Resp 16   LMP 06/07/2018 (Exact Date)   SpO2 100%    Physical Exam  Constitutional: She is oriented to person, place, and time. She appears well-developed and well-nourished.  HENT:  Right Ear: External ear normal.  Left Ear: External ear normal.  Eyes: Pupils are equal, round, and reactive to light. Conjunctivae and EOM are normal.  Neck: Normal range of motion. Neck supple.  Cardiovascular: Normal rate.  Pulmonary/Chest: Effort normal.  Musculoskeletal: Normal range of motion.  Neurological: She is alert and oriented to person, place, and time.  Skin: Skin  is warm and dry.  Nursing note and vitals reviewed.    UC Treatments / Results  Labs (all labs ordered are listed, but only abnormal results are displayed) Labs Reviewed - No data to display  EKG None  Radiology No results found.  Procedures Procedures (including critical care time)  Medications Ordered in UC Medications - No data to display  Initial Impression / Assessment and Plan / UC Course  I have reviewed the triage vital signs and the nursing notes.  Pertinent labs & imaging results that were available during my care of the patient were reviewed by me and considered in my medical decision making (see chart for details).     Final Clinical Impressions(s) / UC Diagnoses   Final diagnoses:  None   Discharge Instructions   None    ED Prescriptions    None     Controlled Substance Prescriptions [place] Controlled Substance Registry consulted? Not Applicable   Elvina Sidle, MD 06/16/18 [phone]

## 2018-06-16 NOTE — Discharge Instructions (Signed)
We are running several tests that will take 2 to 3 days to complete.  Your test today show that you probably have a urinary infection and a bacterial vaginitis.  Often with a bacterial vaginitis, patients will have some element of a yeast infection.  The form treating you for a yeast infection, bacterial vaginitis, and a urinary tract infection.  If you do not see improvement, or if you feel like you are getting worse symptoms, please return

## 2018-06-18 ENCOUNTER — Telehealth (HOSPITAL_COMMUNITY): Payer: Self-pay

## 2018-06-18 LAB — URINE CYTOLOGY ANCILLARY ONLY
Chlamydia: NEGATIVE
Neisseria Gonorrhea: NEGATIVE
Trichomonas: POSITIVE — AB

## 2018-06-18 LAB — URINE CULTURE: Culture: >=100000 — AB

## 2018-06-18 NOTE — Telephone Encounter (Signed)
Trichomonas is positive. Rx metronidazole was given at the urgent care visit. Pt contacted and made aware, educated to please refrain from sexual intercourse for 7 days to give the medicine time to work. Sexual partners need to be notified and tested/treated. Condoms may reduce risk of reinfection. Recheck for further evaluation if symptoms are not improving. Answered all questions. 

## 2018-06-18 NOTE — Telephone Encounter (Signed)
Urine culture positive for E.coli. This was treated with Macrobid.  Pt called and made aware. 

## 2018-07-30 ENCOUNTER — Encounter (HOSPITAL_COMMUNITY): Payer: Self-pay | Admitting: Emergency Medicine

## 2018-07-30 ENCOUNTER — Ambulatory Visit (HOSPITAL_COMMUNITY)
Admission: EM | Admit: 2018-07-30 | Discharge: 2018-07-30 | Disposition: A | Discharge status: Home / Self Care | Payer: Self-pay | Attending: Family Medicine | Admitting: Family Medicine

## 2018-07-30 ENCOUNTER — Other Ambulatory Visit: Payer: Self-pay

## 2018-07-30 DIAGNOSIS — N760 Acute vaginitis: Secondary | ICD-10-CM | POA: Insufficient documentation

## 2018-07-30 DIAGNOSIS — I10 Essential (primary) hypertension: Secondary | ICD-10-CM | POA: Insufficient documentation

## 2018-07-30 DIAGNOSIS — R300 Dysuria: Secondary | ICD-10-CM

## 2018-07-30 LAB — POCT URINALYSIS DIP (DEVICE)
BILIRUBIN URINE: NEGATIVE
GLUCOSE, UA: NEGATIVE mg/dL
KETONES UR: NEGATIVE mg/dL
Nitrite: NEGATIVE
Protein, ur: NEGATIVE mg/dL
SPECIFIC GRAVITY, URINE: 1.02 (ref 1.005–1.030)
Urobilinogen, UA: 0.2 mg/dL (ref 0.0–1.0)
pH: 7 (ref 5.0–8.0)

## 2018-07-30 MED ORDER — METRONIDAZOLE 500 MG PO TABS
ORAL_TABLET | ORAL | Status: AC
  Filled 2018-07-30: qty 4

## 2018-07-30 MED ORDER — METRONIDAZOLE 500 MG PO TABS
2000.0000 mg | ORAL_TABLET | Freq: Once | ORAL | Status: AC
  Administered 2018-07-30: 2000 mg via ORAL

## 2018-07-30 NOTE — ED Triage Notes (Addendum)
Pt reports having unprotected sex two weeks ago.  One week later pt started having vaginal discharge.  Pt was treated with Diflucan, Flagyl, and Macrobid about 6 weeks ago for a positive Trichomonas.  She denies any other symptoms.

## 2018-07-30 NOTE — ED Provider Notes (Signed)
[facility]    CSN: [id number] Arrival date & time: 07/30/18  1005     History   Chief Complaint Chief Complaint  Patient presents with  . Vaginal Discharge  . SEXUALLY TRANSMITTED DISEASE    HPI Faith Castillo is a 42 y.o. female.   She is a 42 year old female with past medical history of trichomonas and hypertension.  She presents with 1 week of clear, watery vaginal discharge with slight itching and irritation.  Symptoms have been constant and remain the same.  She was seen here approximately 6 weeks ago and diagnosed with trichomonas and a UTI.  She reports sexual intercourse with same partner from previous about 2 weeks ago.  She did not tell her partner that she was positive for trichomonas and is unsure if they ever got treatment.  She is having some slight dysuria without hematuria.  She denies any associated abdominal pain, pelvic pain, back pain, fever, chills, body aches.  Her last menstrual period was 07/09/2018.  ROS per HPI        Past Medical History:  Diagnosis Date  . Hypertension     There are no active problems to display for this patient.   History reviewed. No pertinent surgical history.  OB History   None      Home Medications    Prior to Admission medications   Medication Sig Start Date End Date Taking? Authorizing Provider  atenolol-chlorthalidone (TENORETIC) 50-25 MG tablet Take 1 tablet by mouth daily.   Yes [provider]  Cyanocobalamin (B-12 PO) Take 1 tablet by mouth daily.   Yes [provider]  metroNIDAZOLE (FLAGYL) 500 MG tablet Take 1 tablet (500 mg total) by mouth 2 (two) times daily. 06/16/18   Elvina Sidle, MD  nitrofurantoin, macrocrystal-monohydrate, (MACROBID) 100 MG capsule Take 1 capsule (100 mg total) by mouth 2 (two) times daily. 06/16/18   Elvina Sidle, MD    Family History Family History  Problem Relation Age of Onset  . Hypertension Mother   . Hypertension Father     Social  History Social History   Tobacco Use  . Smoking status: Never Smoker  . Smokeless tobacco: Never Used  Substance Use Topics  . Alcohol use: No  . Drug use: No     Allergies   Patient has no known allergies.   Review of Systems Review of Systems   Physical Exam Triage Vital Signs ED Triage Vitals  Enc Vitals Group     BP 07/30/18 1058 132/78     Pulse Rate 07/30/18 1058 70     Resp --      Temp 07/30/18 1058 98.4 F (36.9 C)     Temp Source 07/30/18 1058 Oral     SpO2 07/30/18 1058 100 %     Weight --      Height --      Head Circumference --      Peak Flow --      Pain Score 07/30/18 1102 0     Pain Loc --      Pain Edu? --      Excl. in GC? --    No data found.  Updated Vital Signs BP 132/78 (BP Location: Left Arm)   Pulse 70   Temp 98.4 F (36.9 C) (Oral)   LMP 07/09/2018 (Exact Date)   SpO2 100%   Visual Acuity Right Eye Distance:   Left Eye Distance:   Bilateral Distance:    Right Eye Near:  Left Eye Near:    Bilateral Near:     Physical Exam  Constitutional: She is oriented to person, place, and time. She appears well-developed and well-nourished.  Very pleasant. Non toxic or ill appearing.   HENT:  Head: Normocephalic and atraumatic.  Right Ear: External ear normal.  Left Ear: External ear normal.  Eyes: Conjunctivae are normal.  Neck: Normal range of motion.  Pulmonary/Chest: Effort normal.  Abdominal: Soft. Bowel sounds are normal.  Abdomen soft, non tender. No CVA tenderness. No rebound tenderness.   Genitourinary:  Genitourinary Comments: Deferred.  Patient not having any abdominal pain, pelvic pain, back pain.  Self swab obtained  Musculoskeletal: Normal range of motion.  Neurological: She is alert and oriented to person, place, and time.  Skin: Skin is warm and dry.  Psychiatric: She has a normal mood and affect.  Nursing note and vitals reviewed.    UC Treatments / Results  Labs (all labs ordered are listed, but only  abnormal results are displayed) Labs Reviewed  POCT URINALYSIS DIP (DEVICE) - Abnormal; Notable for the following components:      Result Value   Hgb urine dipstick SMALL (*)    Leukocytes, UA LARGE (*)    All other components within normal limits  URINE CULTURE  CERVICOVAGINAL ANCILLARY ONLY    EKG None  Radiology No results found.  Procedures Procedures (including critical care time)  Medications Ordered in UC Medications  metroNIDAZOLE (FLAGYL) tablet 2,000 mg (2,000 mg Oral Given 07/30/18 1211)    Initial Impression / Assessment and Plan / UC Course  I have reviewed the triage vital signs and the nursing notes.  Pertinent labs & imaging results that were available during my care of the patient were reviewed by me and considered in my medical decision making (see chart for details).     Most likely patient was reinfected and has trichomonas again. We will go ahead and treat for trichomonas today with 2 g of Flagyl. Self swab sent for STD testing Urine showed large leuks and trace blood we will send for culture. Lab results pending Final Clinical Impressions(s) / UC Diagnoses   Final diagnoses:  Acute vaginitis     Discharge Instructions     We are retreating you today for trichomonas infection. We are sending your swab for testing and will call with any positive results We are sending her urine for culture Please make sure that you inform your partner that you were positive for trichomonas. Follow up as needed for continued or worsening symptoms     ED Prescriptions    None     Controlled Substance Prescriptions [place] Controlled Substance Registry consulted? Not Applicable   Janace Aris, NP 07/31/18 [phone]

## 2018-07-30 NOTE — Discharge Instructions (Addendum)
We are retreating you today for trichomonas infection. We are sending your swab for testing and will call with any positive results We are sending her urine for culture Please make sure that you inform your partner that you were positive for trichomonas. Follow up as needed for continued or worsening symptoms

## 2018-07-31 ENCOUNTER — Telehealth (HOSPITAL_COMMUNITY): Payer: Self-pay

## 2018-07-31 LAB — CERVICOVAGINAL ANCILLARY ONLY
Bacterial vaginitis: NEGATIVE
CANDIDA VAGINITIS: NEGATIVE
CHLAMYDIA, DNA PROBE: NEGATIVE
NEISSERIA GONORRHEA: NEGATIVE
Trichomonas: POSITIVE — AB

## 2018-07-31 LAB — URINE CULTURE: Culture: <10000 — AB

## 2018-07-31 NOTE — Telephone Encounter (Signed)
Trichomonas is positive. Rx metronidazole was given at the urgent care visit. Need to educate to please refrain from sexual intercourse for 7 days to give the medicine time to work. Sexual partners need to be notified and tested/treated. Condoms may reduce risk of reinfection. Recheck for further evaluation if symptoms are not improving. Attempted to reach patient regarding results. No answer at this time.

## 2018-08-02 ENCOUNTER — Telehealth (HOSPITAL_COMMUNITY): Payer: Self-pay

## 2018-08-02 NOTE — Telephone Encounter (Signed)
Attempted to reach patient x 2 

## 2018-08-03 ENCOUNTER — Telehealth (HOSPITAL_COMMUNITY): Payer: Self-pay

## 2018-08-03 NOTE — Telephone Encounter (Signed)
Attempted to reach patient x 3. Letter sent. 

## 2021-01-04 ENCOUNTER — Other Ambulatory Visit: Payer: Self-pay

## 2021-01-04 ENCOUNTER — Encounter (HOSPITAL_COMMUNITY): Payer: Self-pay

## 2021-01-04 ENCOUNTER — Ambulatory Visit (HOSPITAL_COMMUNITY)
Admission: EM | Admit: 2021-01-04 | Discharge: 2021-01-04 | Disposition: A | Discharge status: Home / Self Care | Payer: Self-pay | Attending: Family Medicine | Admitting: Family Medicine

## 2021-01-04 DIAGNOSIS — R110 Nausea: Secondary | ICD-10-CM | POA: Insufficient documentation

## 2021-01-04 LAB — COMPREHENSIVE METABOLIC PANEL
ALT: 9 U/L (ref 0–44)
AST: 15 U/L (ref 15–41)
Albumin: 3.6 g/dL (ref 3.5–5.0)
Alkaline Phosphatase: 52 U/L (ref 38–126)
Anion gap: 7 (ref 5–15)
BUN: 9 mg/dL (ref 6–20)
CO2: 24 mmol/L (ref 22–32)
Calcium: 9.3 mg/dL (ref 8.9–10.3)
Chloride: 105 mmol/L (ref 98–111)
Creatinine, Ser: 0.69 mg/dL (ref 0.44–1.00)
GFR, Estimated: >60 mL/min (ref >60)
Glucose, Bld: 94 mg/dL (ref 70–99)
Potassium: 3.6 mmol/L (ref 3.5–5.1)
Sodium: 136 mmol/L (ref 135–145)
Total Bilirubin: 0.7 mg/dL (ref 0.3–1.2)
Total Protein: 7.1 g/dL (ref 6.5–8.1)

## 2021-01-04 LAB — POCT URINALYSIS DIPSTICK, ED / UC
Glucose, UA: NEGATIVE mg/dL
Hgb urine dipstick: NEGATIVE
Ketones, ur: NEGATIVE mg/dL
Nitrite: NEGATIVE
Protein, ur: NEGATIVE mg/dL
Specific Gravity, Urine: 1.02 (ref 1.005–1.030)
Urobilinogen, UA: 4 mg/dL — ABNORMAL HIGH (ref 0.0–1.0)
pH: 8.5 — ABNORMAL HIGH (ref 5.0–8.0)

## 2021-01-04 LAB — POC URINE PREG, ED: Preg Test, Ur: NEGATIVE

## 2021-01-04 LAB — LIPASE, BLOOD: Lipase: 22 U/L (ref 11–51)

## 2021-01-04 LAB — TSH: TSH: 1.072 u[IU]/mL (ref 0.350–4.500)

## 2021-01-04 MED ORDER — ONDANSETRON 4 MG PO TBDP: 4.0000 mg | ORAL_TABLET | Freq: Three times a day (TID) | ORAL | 0 refills | Status: DC | PRN

## 2021-01-04 MED ORDER — OMEPRAZOLE 20 MG PO CPDR: 20.0000 mg | DELAYED_RELEASE_CAPSULE | Freq: Every day | ORAL | 0 refills | Status: DC

## 2021-01-04 NOTE — ED Triage Notes (Signed)
Pt presents with intermittent bouts of nausea & vomiting and loss of appetite over the course of a year.

## 2021-01-04 NOTE — Discharge Instructions (Signed)
You have had labs (blood work) drawn today. We will call you with any significant abnormalities or if there is need to begin or change treatment or pursue further follow up.  You may also review your test results online through MyChart. If you do not have a MyChart account, instructions to sign up should be on your discharge paperwork.  I have also put you in our queue for finding a primary healthcare provider. Someone should be calling you in order to assist you in finding a provider in the near future.

## 2021-01-05 ENCOUNTER — Other Ambulatory Visit: Payer: Self-pay

## 2021-01-05 ENCOUNTER — Encounter (HOSPITAL_COMMUNITY): Payer: Self-pay | Admitting: Emergency Medicine

## 2021-01-05 ENCOUNTER — Observation Stay (HOSPITAL_COMMUNITY)
Admission: EM | Admit: 2021-01-05 | Discharge: 2021-01-06 | Disposition: A | Discharge status: Home / Self Care | Payer: Self-pay | Attending: Internal Medicine | Admitting: Internal Medicine

## 2021-01-05 ENCOUNTER — Telehealth (HOSPITAL_COMMUNITY): Payer: Self-pay | Admitting: Emergency Medicine

## 2021-01-05 DIAGNOSIS — R634 Abnormal weight loss: Secondary | ICD-10-CM | POA: Diagnosis present

## 2021-01-05 DIAGNOSIS — R112 Nausea with vomiting, unspecified: Secondary | ICD-10-CM | POA: Diagnosis present

## 2021-01-05 DIAGNOSIS — Z20822 Contact with and (suspected) exposure to covid-19: Secondary | ICD-10-CM | POA: Insufficient documentation

## 2021-01-05 DIAGNOSIS — I10 Essential (primary) hypertension: Secondary | ICD-10-CM | POA: Diagnosis present

## 2021-01-05 DIAGNOSIS — D649 Anemia, unspecified: Principal | ICD-10-CM | POA: Diagnosis present

## 2021-01-05 LAB — URINALYSIS, ROUTINE W REFLEX MICROSCOPIC
Bilirubin Urine: NEGATIVE
Glucose, UA: NEGATIVE mg/dL
Hgb urine dipstick: NEGATIVE
Ketones, ur: 5 mg/dL — AB
Nitrite: NEGATIVE
Protein, ur: 30 mg/dL — AB
Specific Gravity, Urine: 1.028 (ref 1.005–1.030)
pH: 5 (ref 5.0–8.0)

## 2021-01-05 LAB — RESP PANEL BY RT-PCR (FLU A&B, COVID) ARPGX2
Influenza A by PCR: NEGATIVE
Influenza B by PCR: NEGATIVE
SARS Coronavirus 2 by RT PCR: NEGATIVE

## 2021-01-05 LAB — IRON AND TIBC
Iron: 10 ug/dL — ABNORMAL LOW (ref 28–170)
Saturation Ratios: 2 % — ABNORMAL LOW (ref 10.4–31.8)
TIBC: 419 ug/dL (ref 250–450)
UIBC: 409 ug/dL

## 2021-01-05 LAB — FOLATE: Folate: 14.1 ng/mL (ref >5.9)

## 2021-01-05 LAB — RETICULOCYTES
Immature Retic Fract: 20.8 % — ABNORMAL HIGH (ref 2.3–15.9)
RBC.: 3.36 MIL/uL — ABNORMAL LOW (ref 3.87–5.11)
Retic Count, Absolute: 43.7 10*3/uL (ref 19.0–186.0)
Retic Ct Pct: 1.3 % (ref 0.4–3.1)

## 2021-01-05 LAB — COMPREHENSIVE METABOLIC PANEL
ALT: 8 U/L (ref 0–44)
AST: 17 U/L (ref 15–41)
Albumin: 3.6 g/dL (ref 3.5–5.0)
Alkaline Phosphatase: 50 U/L (ref 38–126)
Anion gap: 5 (ref 5–15)
BUN: 10 mg/dL (ref 6–20)
CO2: 24 mmol/L (ref 22–32)
Calcium: 9.2 mg/dL (ref 8.9–10.3)
Chloride: 109 mmol/L (ref 98–111)
Creatinine, Ser: 0.83 mg/dL (ref 0.44–1.00)
GFR, Estimated: >60 mL/min (ref >60)
Glucose, Bld: 102 mg/dL — ABNORMAL HIGH (ref 70–99)
Potassium: 3.9 mmol/L (ref 3.5–5.1)
Sodium: 138 mmol/L (ref 135–145)
Total Bilirubin: 0.4 mg/dL (ref 0.3–1.2)
Total Protein: 7.1 g/dL (ref 6.5–8.1)

## 2021-01-05 LAB — CBC
HCT: 23.3 % — ABNORMAL LOW (ref 36.0–46.0)
HCT: 22.6 % — ABNORMAL LOW (ref 36.0–46.0)
Hemoglobin: 5.8 g/dL — CL (ref 12.0–15.0)
Hemoglobin: 5.7 g/dL — CL (ref 12.0–15.0)
MCH: 15.7 pg — ABNORMAL LOW (ref 26.0–34.0)
MCH: 15.8 pg — ABNORMAL LOW (ref 26.0–34.0)
MCHC: 24.9 g/dL — ABNORMAL LOW (ref 30.0–36.0)
MCHC: 25.2 g/dL — ABNORMAL LOW (ref 30.0–36.0)
MCV: 63.1 fL — ABNORMAL LOW (ref 80.0–100.0)
MCV: 62.6 fL — ABNORMAL LOW (ref 80.0–100.0)
Platelets: 585 10*3/uL — ABNORMAL HIGH (ref 150–400)
Platelets: 604 10*3/uL — ABNORMAL HIGH (ref 150–400)
RBC: 3.69 MIL/uL — ABNORMAL LOW (ref 3.87–5.11)
RBC: 3.61 MIL/uL — ABNORMAL LOW (ref 3.87–5.11)
RDW: 19.4 % — ABNORMAL HIGH (ref 11.5–15.5)
RDW: 19.5 % — ABNORMAL HIGH (ref 11.5–15.5)
WBC: 7 10*3/uL (ref 4.0–10.5)
WBC: 5.4 10*3/uL (ref 4.0–10.5)
nRBC: 0 % (ref 0.0–0.2)
nRBC: 0 % (ref 0.0–0.2)

## 2021-01-05 LAB — LIPASE, BLOOD: Lipase: 30 U/L (ref 11–51)

## 2021-01-05 LAB — PREPARE RBC (CROSSMATCH)

## 2021-01-05 LAB — I-STAT BETA HCG BLOOD, ED (MC, WL, AP ONLY): I-stat hCG, quantitative: <5 m[IU]/mL (ref <5)

## 2021-01-05 LAB — ABO/RH: ABO/RH(D): A POS

## 2021-01-05 LAB — FERRITIN: Ferritin: 3 ng/mL — ABNORMAL LOW (ref 11–307)

## 2021-01-05 LAB — VITAMIN B12: Vitamin B-12: 813 pg/mL (ref 180–914)

## 2021-01-05 LAB — POC OCCULT BLOOD, ED: Fecal Occult Bld: NEGATIVE

## 2021-01-05 LAB — HIV ANTIBODY (ROUTINE TESTING W REFLEX): HIV Screen 4th Generation wRfx: NONREACTIVE

## 2021-01-05 MED ORDER — ACETAMINOPHEN 650 MG RE SUPP: 650.0000 mg | Freq: Four times a day (QID) | RECTAL | Status: DC | PRN

## 2021-01-05 MED ORDER — SODIUM CHLORIDE 0.9% FLUSH
3.0000 mL | Freq: Two times a day (BID) | INTRAVENOUS | Status: DC
  Administered 2021-01-06: 3 mL via INTRAVENOUS

## 2021-01-05 MED ORDER — SODIUM CHLORIDE 0.9 % IV SOLN
10.0000 mL/h | Freq: Once | INTRAVENOUS | Status: AC
  Administered 2021-01-05: 10 mL/h via INTRAVENOUS

## 2021-01-05 MED ORDER — PANTOPRAZOLE SODIUM 40 MG PO TBEC
40.0000 mg | DELAYED_RELEASE_TABLET | Freq: Every day | ORAL | Status: DC
  Administered 2021-01-06: 40 mg via ORAL
  Filled 2021-01-05: qty 1

## 2021-01-05 MED ORDER — SODIUM CHLORIDE 0.9 % IV SOLN
510.0000 mg | Freq: Once | INTRAVENOUS | Status: AC
  Administered 2021-01-06: 510 mg via INTRAVENOUS
  Filled 2021-01-05: qty 17

## 2021-01-05 MED ORDER — ONDANSETRON HCL 4 MG PO TABS: 4.0000 mg | ORAL_TABLET | Freq: Four times a day (QID) | ORAL | Status: DC | PRN

## 2021-01-05 MED ORDER — ONDANSETRON HCL 4 MG/2ML IJ SOLN: 4.0000 mg | Freq: Four times a day (QID) | INTRAMUSCULAR | Status: DC | PRN

## 2021-01-05 MED ORDER — ACETAMINOPHEN 325 MG PO TABS: 650.0000 mg | ORAL_TABLET | Freq: Four times a day (QID) | ORAL | Status: DC | PRN

## 2021-01-05 MED ORDER — ALBUTEROL SULFATE (2.5 MG/3ML) 0.083% IN NEBU: 2.5000 mg | INHALATION_SOLUTION | Freq: Four times a day (QID) | RESPIRATORY_TRACT | Status: DC | PRN

## 2021-01-05 NOTE — ED Notes (Addendum)
Report from previous shift RN at this time. Patient arrived to bed 36 at this time, blood transfusion running. In NAD, having dinner. Per previous shift RN this is first unit, patient will need an additional unit upon completion. Documentation paperwork of consent and infusion at bedside.

## 2021-01-05 NOTE — ED Notes (Signed)
Lab called critical lab for Hgb of 5.7. Caroline-PA and Janis-RN notified.

## 2021-01-05 NOTE — ED Notes (Signed)
PA [facility] regarding pt's Hgb of 5.7

## 2021-01-05 NOTE — ED Notes (Signed)
Dr Madelyn Flavors at bedside

## 2021-01-05 NOTE — ED Notes (Signed)
Patient's blood transfusion completed at this time. Patient in NAD, VSS, family at bedside.

## 2021-01-05 NOTE — ED Triage Notes (Signed)
Pt seen at [facility] yesterday for nausea and vomiting.  Received call today that Hgb was less than 6.  Reports fatigue.

## 2021-01-05 NOTE — Telephone Encounter (Signed)
received call from zelda in the [facility].  Called to report a critical value.  Reported hgb as 5.8.  Notified dr hagler, already aware.

## 2021-01-05 NOTE — ED Provider Notes (Signed)
[facility] EMERGENCY DEPARTMENT Provider Note   CSN: [id number] Arrival date & time: 01/05/21  1300     History Chief Complaint  Patient presents with   abnormal labs    Faith Castillo is a 45 y.o. female with a past medical history significant for hypertension not currently on any medications who presents to the ED due to abnormal lab. Patient was seen at urgent care yesterday for chronic nausea and vomiting where routine labs were drawn and patient was found to have a  hemoglobin of 5.8.  She admits to a history of iron deficiency anemia.  No previous blood transfusions.  She notes she intermittently takes iron pills however, has not taken any over the past year.  She admits to fatigue for numerous months which she attributed to working third shift.  Patient denies associated dizziness, shortness of breath, and chest pain. She admits to heavy menstrual cycles which are typical for her. LMP 12/18/2020. Denies any change in menses from her baseline; however passes clots typically. Changes her pad every 2-3 hours. Patient denies melena, hematochezia, and hematemesis.  She admits to taking frequent ibuprofen, BC powder, and Tylenol for low back pain as she is a CNA lifting patients daily. No previous colonoscopy. She is not followed by a PCP.   Patient was evaluated at urgent care yesterday for chronic nausea and emesis.  Patient admits to roughly 1-2 episodes of emesis weekly for the past year typically associated with meals.  Denies associated abdominal pain.  No previous abdominal operations.  She last vomited on Monday (twice). She takes Pepto weekly for chronic nausea, roughly 3 times weekly. Denies fever and chills. No complaints right now. Denies nausea. Denies abdominal pain.   History obtained from patient and past medical records. No interpreter used during encounter.      Past Medical History:  Diagnosis Date   Hypertension     There are no problems to display for  this patient.   History reviewed. No pertinent surgical history.   OB History   No obstetric history on file.     Family History  Problem Relation Age of Onset   Hypertension Mother    Hypertension Father     Social History   Tobacco Use   Smoking status: Never Smoker   Smokeless tobacco: Never Used  Vaping Use   Vaping Use: Never used  Substance Use Topics   Alcohol use: No   Drug use: No    Home Medications Prior to Admission medications   Medication Sig Start Date End Date Taking? Authorizing Provider  Cyanocobalamin (B-12 PO) Take 1 tablet by mouth daily.   Yes [provider]  Multiple Vitamins-Minerals (MULTIVITAMIN ADULT) CHEW Chew 1 tablet by mouth daily.   Yes [provider]  omeprazole (PRILOSEC) 20 MG capsule Take 1 capsule (20 mg total) by mouth daily. 01/04/21  Yes Mardella Layman, MD  ondansetron (ZOFRAN-ODT) 4 MG disintegrating tablet Take 1 tablet (4 mg total) by mouth every 8 (eight) hours as needed for nausea or vomiting. 01/04/21  Yes Mardella Layman, MD    Allergies    Patient has no known allergies.  Review of Systems   Review of Systems  Constitutional: Positive for fatigue. Negative for chills and fever.  Respiratory: Negative for shortness of breath.   Cardiovascular: Negative for chest pain.  Gastrointestinal: Positive for nausea and vomiting. Negative for abdominal pain.  Neurological: Negative for dizziness.  All other systems reviewed and are negative.  Physical Exam Updated Vital Signs BP 113/71 (BP Location: Right Arm)    Pulse 77    Temp 98 F (36.7 C) (Oral)    Resp 13    LMP 12/18/2020    SpO2 100%   Physical Exam Vitals and nursing note reviewed.  Constitutional:      General: She is not in acute distress. HENT:     Head: Normocephalic.  Eyes:     Pupils: Pupils are equal, round, and reactive to light.  Cardiovascular:     Rate and Rhythm: Normal rate and regular rhythm.     Pulses: Normal pulses.      Heart sounds: Normal heart sounds. No murmur heard. No friction rub. No gallop.   Pulmonary:     Effort: Pulmonary effort is normal.     Breath sounds: Normal breath sounds.  Abdominal:     General: Abdomen is flat. Bowel sounds are normal. There is no distension.     Palpations: Abdomen is soft.     Tenderness: There is no abdominal tenderness. There is no guarding or rebound.     Comments: Abdomen soft, nondistended, nontender to palpation in all quadrants without guarding or peritoneal signs. No rebound.   Genitourinary:    Comments: Rectal exam performed with chaperone in room.  Brown stool. Musculoskeletal:        General: Normal range of motion.     Cervical back: Neck supple.  Skin:    General: Skin is warm and dry.  Neurological:     General: No focal deficit present.  Psychiatric:        Mood and Affect: Mood normal.        Behavior: Behavior normal.     ED Results / Procedures / Treatments   Labs (all labs ordered are listed, but only abnormal results are displayed) Labs Reviewed  COMPREHENSIVE METABOLIC PANEL - Abnormal; Notable for the following components:      Result Value   Glucose, Bld 102 (*)    All other components within normal limits  CBC - Abnormal; Notable for the following components:   RBC 3.61 (*)    Hemoglobin 5.7 (*)    HCT 22.6 (*)    MCV 62.6 (*)    MCH 15.8 (*)    MCHC 25.2 (*)    RDW 19.5 (*)    Platelets 604 (*)    All other components within normal limits  RESP PANEL BY RT-PCR (FLU A&B, COVID) ARPGX2  LIPASE, BLOOD  URINALYSIS, ROUTINE W REFLEX MICROSCOPIC  VITAMIN B12  FOLATE  IRON AND TIBC  FERRITIN  RETICULOCYTES  POC OCCULT BLOOD, ED  I-STAT BETA HCG BLOOD, ED ([facility], [facility], [facility] ONLY)  TYPE AND SCREEN  ABO/RH  PREPARE RBC (CROSSMATCH)    EKG None  Radiology No results found.  Procedures .Critical Care Performed by: Mannie Stabile, PA-C Authorized by: Mannie Stabile, PA-C   Critical care provider  statement:    Critical care time (minutes):  45   Critical care time was exclusive of:  Separately billable procedures and treating other patients and teaching time   Critical care was necessary to treat or prevent imminent or life-threatening deterioration of the following conditions:  Circulatory failure and metabolic crisis   Critical care was time spent personally by me on the following activities:  Discussions with consultants, evaluation of patient's response to treatment, examination of patient, ordering and performing treatments and interventions, ordering and review of laboratory studies, ordering and review of  radiographic studies, pulse oximetry, re-evaluation of patient's condition, obtaining history from patient or surrogate and review of old charts   I assumed direction of critical care for this patient from another provider in my specialty: no     Care discussed with: admitting provider       Medications Ordered in ED Medications  0.9 %  sodium chloride infusion (has no administration in time range)    ED Course  I have reviewed the triage vital signs and the nursing notes.  Pertinent labs & imaging results that were available during my care of the patient were reviewed by me and considered in my medical decision making (see chart for details).  Clinical Course as of 01/05/21 1706  Wed Jan 05, 2021  1612 Hemoglobin(!!): 5.7 [CA]  1632 Fecal Occult Blood, POC: NEGATIVE [CA]    Clinical Course User Index [CA] Mannie Stabile, PA-C   MDM Rules/Calculators/A&P                         45 year old female presents to the ED due to hemoglobin of 5.8 yesterday at urgent care.  Patient was evaluated in urgent care yesterday for chronic nausea and vomiting for the past year and was found to be severely anemic.  Patient admits to chronic fatigue.  Denies hematochezia, hematemesis, and melena.  Previous history of iron deficiency anemia however, is not currently on any iron pills.   Upon arrival, vitals all within normal limits.  Patient in no acute distress and nontoxic-appearing.  Physical exam reassuring.  Abdomen soft, nondistended, nontender.  Doubt acute abdomen.  Rectal exam performed with chaperone in room. Routine labs to confirm hemoglobin.   CBC significant for hemoglobin 5.7. Discussed risk and benefits of blood transfusion. Patient agreeable to transfusion.  CMP reassuring with normal renal function and no major electrolyte derangements.  Pregnancy test negative. Normal orthostatic vitals. Patient has remained hemodynamically stable while here in the ED. Fecal occult negative.   5:04 PM discussed case with Dr. Katrinka Blazing with [facility] who agrees to admit patient for further treatment. COVID test pending.  Final Clinical Impression(s) / ED Diagnoses Final diagnoses:  Symptomatic anemia    Rx / DC Orders ED Discharge Orders    None       Jesusita Oka 01/05/21 1707    Wynetta Fines, MD 01/06/21 1504

## 2021-01-05 NOTE — ED Notes (Signed)
Tolerating transfusion of prbc well with no adverse reaction

## 2021-01-05 NOTE — ED Provider Notes (Signed)
[facility] CARE CENTER   [id number] 01/04/21 Arrival Time: 1501  ASSESSMENT & PLAN:  1. Chronic nausea     Question GERD component. Begin: Meds ordered this encounter  Medications  . omeprazole (PRILOSEC) 20 MG capsule    Sig: Take 1 capsule (20 mg total) by mouth daily.    Dispense:  30 capsule    Refill:  0  . ondansetron (ZOFRAN-ODT) 4 MG disintegrating tablet    Sig: Take 1 tablet (4 mg total) by mouth every 8 (eight) hours as needed for nausea or vomiting.    Dispense:  15 tablet    Refill:  0   CBC, CMP, Lipase, TSH pending. Will inform of any significant abnormalities.    Discharge Instructions     You have had labs (blood work) drawn today. We will call you with any significant abnormalities or if there is need to begin or change treatment or pursue further follow up.  You may also review your test results online through MyChart. If you do not have a MyChart account, instructions to sign up should be on your discharge paperwork.  I have also put you in our queue for finding a primary healthcare provider. Someone should be calling you in order to assist you in finding a provider in the near future.     Reviewed expectations re: course of current medical issues. Questions answered. Outlined signs and symptoms indicating need for more acute intervention. Patient verbalized understanding. After Visit Summary given.   SUBJECTIVE: History from: patient.  Faith Castillo is a 45 y.o. female who reports chronic nausea with sporadic episodes of emesis; feels symptoms present over past year. Is fatigued at times. Normal bowel/bladder habits. No diarrhea/constipation. Patient's last menstrual period was 12/18/2020. No previous healthcare evaluation for current symptoms.  Social History   Tobacco Use  Smoking Status Never Smoker  Smokeless Tobacco Never Used   Social History   Substance and Sexual Activity  Alcohol Use No   Denies street drug use. No new  medications. Does reports episodes of epigastric discomfort after eating. Frequent belching at times.   OBJECTIVE:  Vitals:   01/04/21 1612  BP: (!) 133/97  Pulse: 93  Resp: 17  Temp: 99.1 F (37.3 C)  TempSrc: Oral  SpO2: 100%    General appearance: alert; no distress Oropharynx: dry Lungs: unlabored Heart: regular Abdomen: soft; non-distended Extremities: no edema; symmetrical with no gross deformities Skin: warm; dry Neurologic: normal gait Psychological: alert and cooperative; normal mood and affect   No Known Allergies                                             Past Medical History:  Diagnosis Date  . Hypertension    Social History   Socioeconomic History  . Marital status: Single    Spouse name: Not on file  . Number of children: Not on file  . Years of education: Not on file  . Highest education level: Not on file  Occupational History  . Not on file  Tobacco Use  . Smoking status: Never Smoker  . Smokeless tobacco: Never Used  Vaping Use  . Vaping Use: Never used  Substance and Sexual Activity  . Alcohol use: No  . Drug use: No  . Sexual activity: Yes  Other Topics Concern  . Not on file  Social History Narrative  .  Not on file   Social Determinants of Health   Financial Resource Strain: Not on file  Food Insecurity: Not on file  Transportation Needs: Not on file  Physical Activity: Not on file  Stress: Not on file  Social Connections: Not on file  Intimate Partner Violence: Not on file   Family History  Problem Relation Age of Onset  . Hypertension Mother   . Hypertension Father      Mardella Layman, MD 01/05/21 [phone]

## 2021-01-05 NOTE — H&P (Signed)
History and Physical    JENENE KAUFFMANN ZOX:[id number] DOB: [birth year] DOA: 01/05/2021  Referring MD/NP/PA: Claudette Stapler, PA-C PCP: Patient, No Pcp Per  Patient coming from: Home  Chief Complaint: Nausea and vomiting  I have personally briefly reviewed patient's old medical records in [facility]   HPI: Faith Castillo is a 45 y.o. female with medical history significant of anemia and hypertension with complaints of intermittent nausea and vomiting over the last year.  Normally well vomit once or twice per week on average after eating and denies any blood in emesis.  Associated symptoms include weight loss of over 30 pounds since October of last year, fatigue, heartburn, and frequent belching.  Takes Pepto-Bismol intermittently as needed for symptoms.  Denies any fevers, abdominal pain, dysuria, chest pain, shortness of breath, or recent sick contacts.  Seen at urgent care yesterday for her symptoms and had been prescribed omeprazole and Zofran as needed for nausea.  Blood work had also been obtained at that time and she was called back and told that her hemoglobin was 5.8 and to come to the hospital.  Her last menstrual cycle was at the beginning of this month and she normally bleeds heavily.  She has no primary care provider and has been intermittently going to urgent care for blood pressure medications of atenolol-hydrochlorothiazide.  Routine health screening such as mammogram she is not up-to-date on.  Patient does intermittently use NSAIDs reportedly for back pain.  She does not drink alcohol, smoke tobacco, or doing illicit drugs.  ED Course: Upon admission into the emergency department patient was seen to have stable vital signs.  Labs revealed hemoglobin relatively unchanged from prior day 5.7 and platelets 604.  Stool guaiacs were negative.  Patient was typed and screened and ordered 2 units of packed red blood cells.  [facility] called to admit.  Anemia panel pending.  Review of Systems   Constitutional: Positive for malaise/fatigue and weight loss. Negative for fever.  HENT: Negative for congestion and ear discharge.   Eyes: Negative for double vision and photophobia.  Respiratory: Negative for cough and shortness of breath.   Cardiovascular: Negative for chest pain and leg swelling.  Gastrointestinal: Positive for heartburn, nausea and vomiting. Negative for diarrhea.  Genitourinary: Negative for dysuria and flank pain.  Musculoskeletal: Positive for back pain.  Skin: Negative for itching.  Neurological: Positive for weakness. Negative for focal weakness and loss of consciousness.  Psychiatric/Behavioral: Negative for substance abuse. The patient does not have insomnia.     Past Medical History:  Diagnosis Date  . Hypertension     History reviewed. No pertinent surgical history.   reports that she has never smoked. She has never used smokeless tobacco. She reports that she does not drink alcohol and does not use drugs.  No Known Allergies  Family History  Problem Relation Age of Onset  . Hypertension Mother   . Hypertension Father     Prior to Admission medications   Medication Sig Start Date End Date Taking? Authorizing Provider  Cyanocobalamin (B-12 PO) Take 1 tablet by mouth daily.   Yes [provider]  Multiple Vitamins-Minerals (MULTIVITAMIN ADULT) CHEW Chew 1 tablet by mouth daily.   Yes [provider]  omeprazole (PRILOSEC) 20 MG capsule Take 1 capsule (20 mg total) by mouth daily. 01/04/21  Yes Mardella Layman, MD  ondansetron (ZOFRAN-ODT) 4 MG disintegrating tablet Take 1 tablet (4 mg total) by mouth every 8 (eight) hours as needed for nausea or vomiting.  01/04/21  Yes Mardella Layman, MD    Physical Exam:  Constitutional: Middle-aged female who appears to be in no acute distress Vitals:   01/05/21 1327 01/05/21 1603 01/05/21 1650  BP: 121/75 122/83 113/71  Pulse: 88 89 77  Resp: 16 18 13   Temp: 98 F (36.7 C)    TempSrc: Oral     SpO2: 100% 100% 100%   Eyes: PERRL, lids and conjunctivae normal ENMT: Mucous membranes are moist. Posterior pharynx clear of any exudate or lesions.  Neck: normal, supple, no masses, no thyromegaly Respiratory: clear to auscultation bilaterally, no wheezing, no crackles. Normal respiratory effort. No accessory muscle use.  Cardiovascular: Regular rate and rhythm, no murmurs / rubs / gallops. No extremity edema. 2+ pedal pulses. No carotid bruits.  Abdomen: no tenderness, no masses palpated. No hepatosplenomegaly. Bowel sounds positive.  Musculoskeletal: no clubbing / cyanosis. No joint deformity upper and lower extremities. Good ROM, no contractures. Normal muscle tone.  Skin:  pallor present Neurologic: CN 2-12 grossly intact. Sensation intact, DTR normal. Strength 5/5 in all 4.  Psychiatric: Normal judgment and insight. Alert and oriented x 3. Normal mood.     Labs on Admission: I have personally reviewed following labs and imaging studies  CBC: Recent Labs  Lab 01/04/21 1702 01/05/21 1338  WBC 7.0 5.4  HGB 5.8* 5.7*  HCT 23.3* 22.6*  MCV 63.1* 62.6*  PLT 585* 604*   Basic Metabolic Panel: Recent Labs  Lab 01/04/21 1702 01/05/21 1338  NA 136 138  K 3.6 3.9  CL 105 109  CO2 24 24  GLUCOSE 94 102*  BUN 9 10  CREATININE 0.69 0.83  CALCIUM 9.3 9.2   GFR: CrCl cannot be calculated (Unknown ideal weight.). Liver Function Tests: Recent Labs  Lab 01/04/21 1702 01/05/21 1338  AST 15 17  ALT 9 8  ALKPHOS 52 50  BILITOT 0.7 0.4  PROT 7.1 7.1  ALBUMIN 3.6 3.6   Recent Labs  Lab 01/04/21 1702 01/05/21 1501  LIPASE 22 30   No results for input(s): AMMONIA in the last 168 hours. Coagulation Profile: No results for input(s): INR, PROTIME in the last 168 hours. Cardiac Enzymes: No results for input(s): CKTOTAL, CKMB, CKMBINDEX, TROPONINI in the last 168 hours. BNP (last 3 results) No results for input(s): PROBNP in the last 8760 hours. HbA1C: No results for  input(s): HGBA1C in the last 72 hours. CBG: No results for input(s): GLUCAP in the last 168 hours. Lipid Profile: No results for input(s): CHOL, HDL, LDLCALC, TRIG, CHOLHDL, LDLDIRECT in the last 72 hours. Thyroid Function Tests: Recent Labs    01/04/21 1702  TSH 1.072   Anemia Panel: No results for input(s): VITAMINB12, FOLATE, FERRITIN, TIBC, IRON, RETICCTPCT in the last 72 hours. Urine analysis:    Component Value Date/Time   LABSPEC 1.020 01/04/2021 1649   PHURINE 8.5 (H) 01/04/2021 1649   GLUCOSEU NEGATIVE 01/04/2021 1649   HGBUR NEGATIVE 01/04/2021 1649   BILIRUBINUR SMALL (A) 01/04/2021 1649   KETONESUR NEGATIVE 01/04/2021 1649   PROTEINUR NEGATIVE 01/04/2021 1649   UROBILINOGEN 4.0 (H) 01/04/2021 1649   NITRITE NEGATIVE 01/04/2021 1649   LEUKOCYTESUR TRACE (A) 01/04/2021 1649   Sepsis Labs: No results found for this or any previous visit (from the past 240 hour(s)).   Radiological Exams on Admission: No results found.    Assessment/Plan Symptomatic anemia: On admission patient was seen to have a hemoglobin of 5.7, but relatively unchanged from the previous day.  Stool guaiacs were noted  to be negative.  Patient does admit to having heavy menstrual periods.  Suspect iron deficiency given the low MCV and MCH . -Admit to a medical telemetry bed -Follow-up anemia panel -Continue with transfusion of 2 units of packed red blood cell -Feraheme infusion in a.m. -Recheck CBC in a.m.   Nausea and vomiting: Patient with reports of intermittent nausea and vomiting over the last 1 year.  Denies any abdominal pain, but did report intermittent belching and heartburn symptoms.  She had picked up her prescription for omeprazole which was prescribed by urgent care yesterday. -Advised patient of the need to refrain from use of frequent NSAIDs as this could put her at risk for peptic ulcers -Continue pharmacy substitution of Protonix  Weight loss: Acute.  Patient reports previously  weighing around 226 pounds in October 2021, but last weighed herself and was 185 pounds sometime in February.  TSH was noted to be within normal limits yesterday. -Transitions of care consult for need of primary care provider to work-up further in outpatient setting -May benefit from referral to gastroenterology  Essential hypertension: Blood pressures currently stable.  Patient previously reported being on medications of atenolol-hydrochlorothiazide, but was currently not taking anything.  Possibly related with patient's weight loss. -Continue to monitor    DVT prophylaxis: SCDs Code Status: Full Family Communication: None Disposition Plan: Likely discharge home in a.m. Consults called: None Admission status: Observation  Clydie Braun MD [facility]   If 7PM-7AM, please contact night-coverage   01/05/2021, 5:08 PM

## 2021-01-05 NOTE — ED Triage Notes (Signed)
Called x 3 NO answer 

## 2021-01-06 DIAGNOSIS — D649 Anemia, unspecified: Secondary | ICD-10-CM

## 2021-01-06 LAB — BASIC METABOLIC PANEL
Anion gap: 8 (ref 5–15)
BUN: 14 mg/dL (ref 6–20)
CO2: 21 mmol/L — ABNORMAL LOW (ref 22–32)
Calcium: 9.1 mg/dL (ref 8.9–10.3)
Chloride: 108 mmol/L (ref 98–111)
Creatinine, Ser: 0.69 mg/dL (ref 0.44–1.00)
GFR, Estimated: >60 mL/min (ref >60)
Glucose, Bld: 98 mg/dL (ref 70–99)
Potassium: 3.5 mmol/L (ref 3.5–5.1)
Sodium: 137 mmol/L (ref 135–145)

## 2021-01-06 LAB — TYPE AND SCREEN
ABO/RH(D): A POS
Antibody Screen: NEGATIVE
Unit division: 0
Unit division: 0

## 2021-01-06 LAB — CBC
HCT: 29.5 % — ABNORMAL LOW (ref 36.0–46.0)
HCT: 26.5 % — ABNORMAL LOW (ref 36.0–46.0)
Hemoglobin: 8.2 g/dL — ABNORMAL LOW (ref 12.0–15.0)
Hemoglobin: 7.4 g/dL — ABNORMAL LOW (ref 12.0–15.0)
MCH: 18.5 pg — ABNORMAL LOW (ref 26.0–34.0)
MCH: 18.8 pg — ABNORMAL LOW (ref 26.0–34.0)
MCHC: 27.8 g/dL — ABNORMAL LOW (ref 30.0–36.0)
MCHC: 27.9 g/dL — ABNORMAL LOW (ref 30.0–36.0)
MCV: 66.4 fL — ABNORMAL LOW (ref 80.0–100.0)
MCV: 67.4 fL — ABNORMAL LOW (ref 80.0–100.0)
Platelets: 526 10*3/uL — ABNORMAL HIGH (ref 150–400)
Platelets: 467 10*3/uL — ABNORMAL HIGH (ref 150–400)
RBC: 4.44 MIL/uL (ref 3.87–5.11)
RBC: 3.93 MIL/uL (ref 3.87–5.11)
RDW: 22 % — ABNORMAL HIGH (ref 11.5–15.5)
RDW: 21.6 % — ABNORMAL HIGH (ref 11.5–15.5)
WBC: 15.2 10*3/uL — ABNORMAL HIGH (ref 4.0–10.5)
WBC: 9 10*3/uL (ref 4.0–10.5)
nRBC: 0 % (ref 0.0–0.2)
nRBC: 0.2 % (ref 0.0–0.2)

## 2021-01-06 LAB — BPAM RBC
Blood Product Expiration Date: 202204142359
Blood Product Expiration Date: 202204182359
ISSUE DATE / TIME: 202203231705
ISSUE DATE / TIME: 202203231935
Unit Type and Rh: 6200
Unit Type and Rh: 6200

## 2021-01-06 MED ORDER — POLYSACCHARIDE IRON COMPLEX 150 MG PO CAPS: 150.0000 mg | ORAL_CAPSULE | Freq: Every day | ORAL | Status: DC

## 2021-01-06 MED ORDER — LOPERAMIDE HCL 2 MG PO CAPS
2.0000 mg | ORAL_CAPSULE | ORAL | Status: DC | PRN
  Administered 2021-01-06: 2 mg via ORAL
  Filled 2021-01-06: qty 1

## 2021-01-06 NOTE — Discharge Summary (Signed)
Physician Discharge Summary  Faith Castillo AST:[id number] DOB: 01/27/1976 DOA: 01/05/2021  PCP: Patient, No Pcp Per  Admit date: 01/05/2021 Discharge date: 01/06/2021  Admitted From: home Discharge disposition: home   Recommendations for Outpatient Follow-Up:   1. appointmment made with PCP 2. Cbc at that time 3. Daily Fe 4. May need referral to gyn  5. Outpatient routine health screens   Discharge Diagnosis:   Principal Problem:   Symptomatic anemia Active Problems:   Nausea & vomiting   Weight loss   Essential hypertension    Discharge Condition: Improved.  Diet recommendation: Regular.  Wound care: None.  Code status: Full.   History of Present Illness:    Faith Castillo is a 45 y.o. female with medical history significant of anemia and hypertension with complaints of intermittent nausea and vomiting over the last year.  Normally well vomit once or twice per week on average after eating and denies any blood in emesis.  Associated symptoms include weight loss of over 30 pounds since October of last year, fatigue, heartburn, and frequent belching.  Takes Pepto-Bismol intermittently as needed for symptoms.  Denies any fevers, abdominal pain, dysuria, chest pain, shortness of breath, or recent sick contacts.  Seen at urgent care yesterday for her symptoms and had been prescribed omeprazole and Zofran as needed for nausea.  Blood work had also been obtained at that time and she was called back and told that her hemoglobin was 5.8 and to come to the hospital.  Her last menstrual cycle was at the beginning of this month and she normally bleeds heavily.  She has no primary care provider and has been intermittently going to urgent care for blood pressure medications of atenolol-hydrochlorothiazide.  Routine health screening such as mammogram she is not up-to-date on.  Patient does intermittently use NSAIDs reportedly for back pain.  She does not drink alcohol, smoke tobacco,  or doing illicit drugs.   Hospital Course by Problem:   Symptomatic anemia: On admission patient was seen to have a hemoglobin of 5.7, but relatively unchanged from the previous day.  Stool guaiacs were noted to be negative.  Patient does admit to having heavy menstrual periods.  Suspect iron deficiency given the low MCV and MCH . -Continue with transfusion of 2 units of packed red blood cell -s/p Feraheme infusion   Nausea and vomiting: Patient with reports of intermittent nausea and vomiting over the last 1 year.  Denies any abdominal pain, but did report intermittent belching and heartburn symptoms.  She had picked up her prescription for omeprazole which was prescribed by urgent care yesterday. -Advised patient of the need to refrain from use of frequent NSAIDs as this could put her at risk for peptic ulcers   Weight loss: Acute.  Patient reports previously weighing around 226 pounds in October 2021, but last weighed herself and was 185 pounds sometime in February.  TSH was noted to be within normal limits yesterday. -Transitions of care consult for need of primary care provider to work-up further in outpatient setting -May benefit from referral to gastroenterology  Essential hypertension: Blood pressures currently stable.  Patient previously reported being on medications of atenolol-hydrochlorothiazide, but was currently not taking anything.  Possibly related with patient's weight loss. -no need for BP meds    Medical Consultants:      Discharge Exam:   Vitals:   01/06/21 0800 01/06/21 1205  BP: (!) 108/58 122/78  Pulse: 74 71  Resp: 18 18  Temp: 98 F (36.7 C) 98.3 F (36.8 C)  SpO2: 99% 100%   Vitals:   01/06/21 0041 01/06/21 0447 01/06/21 0800 01/06/21 1205  BP: 126/85 115/78 (!) 108/58 122/78  Pulse: 85 72 74 71  Resp: 17 20 18 18   Temp: 98.4 F (36.9 C) 98.6 F (37 C) 98 F (36.7 C) 98.3 F (36.8 C)  TempSrc: Oral Oral Oral Oral  SpO2: 99% 100% 99% 100%   Weight: 79.5 kg     Height: 5\' 6"  (1.676 m)       General exam: Appears calm and comfortable.    The results of significant diagnostics from this hospitalization (including imaging, microbiology, ancillary and laboratory) are listed below for reference.     Procedures and Diagnostic Studies:   No results found.   Labs:   Basic Metabolic Panel: Recent Labs  Lab 01/04/21 1702 01/05/21 1338 01/06/21 0118  NA 136 138 137  K 3.6 3.9 3.5  CL 105 109 108  CO2 24 24 21*  GLUCOSE 94 102* 98  BUN 9 10 14   CREATININE 0.69 0.83 0.69  CALCIUM 9.3 9.2 9.1   GFR Estimated Creatinine Clearance: 94.5 mL/min (by C-G formula based on SCr of 0.69 mg/dL). Liver Function Tests: Recent Labs  Lab 01/04/21 1702 01/05/21 1338  AST 15 17  ALT 9 8  ALKPHOS 52 50  BILITOT 0.7 0.4  PROT 7.1 7.1  ALBUMIN 3.6 3.6   Recent Labs  Lab 01/04/21 1702 01/05/21 1501  LIPASE 22 30   No results for input(s): AMMONIA in the last 168 hours. Coagulation profile No results for input(s): INR, PROTIME in the last 168 hours.  CBC: Recent Labs  Lab 01/04/21 1702 01/05/21 1338 01/06/21 0118 01/06/21 1150  WBC 7.0 5.4 15.2* 9.0  HGB 5.8* 5.7* 8.2* 7.4*  HCT 23.3* 22.6* 29.5* 26.5*  MCV 63.1* 62.6* 66.4* 67.4*  PLT 585* 604* 526* 467*   Cardiac Enzymes: No results for input(s): CKTOTAL, CKMB, CKMBINDEX, TROPONINI in the last 168 hours. BNP: Invalid input(s): POCBNP CBG: No results for input(s): GLUCAP in the last 168 hours. D-Dimer No results for input(s): DDIMER in the last 72 hours. Hgb A1c No results for input(s): HGBA1C in the last 72 hours. Lipid Profile No results for input(s): CHOL, HDL, LDLCALC, TRIG, CHOLHDL, LDLDIRECT in the last 72 hours. Thyroid function studies Recent Labs    01/04/21 1702  TSH 1.072   Anemia work up Recent Labs    01/05/21 1613  VITAMINB12 813  FOLATE 14.1  FERRITIN 3*  TIBC 419  IRON 10*  RETICCTPCT 1.3   Microbiology Recent Results  (from the past 240 hour(s))  Resp Panel by RT-PCR (Flu A&B, Covid) Nasopharyngeal Swab     Status: None   Collection Time: 01/05/21  5:21 PM   Specimen: Nasopharyngeal Swab; Nasopharyngeal(NP) swabs in vial transport medium  Result Value Ref Range Status   SARS Coronavirus 2 by RT PCR NEGATIVE NEGATIVE Final    Comment: (NOTE) SARS-CoV-2 target nucleic acids are NOT DETECTED.  The SARS-CoV-2 RNA is generally detectable in upper respiratory specimens during the acute phase of infection. The lowest concentration of SARS-CoV-2 viral copies this assay can detect is 138 copies/mL. A negative result does not preclude SARS-Cov-2 infection and should not be used as the sole basis for treatment or other patient management decisions. A negative result may occur with  improper specimen collection/handling, submission of specimen other than nasopharyngeal swab, presence of viral mutation(s) within the areas targeted by  this assay, and inadequate number of viral copies(<138 copies/mL). A negative result must be combined with clinical observations, patient history, and epidemiological information. The expected result is Negative.  Fact Sheet for Patients:  BloggerCourse.com  Fact Sheet for Healthcare Providers:  SeriousBroker.it  This test is no t yet approved or cleared by the [place] FDA and  has been authorized for detection and/or diagnosis of SARS-CoV-2 by FDA under an Emergency Use Authorization (EUA). This EUA will remain  in effect (meaning this test can be used) for the duration of the COVID-19 declaration under Section 564(b)(1) of the Act, 21 U.S.C.section 360bbb-3(b)(1), unless the authorization is terminated  or revoked sooner.       Influenza A by PCR NEGATIVE NEGATIVE Final   Influenza B by PCR NEGATIVE NEGATIVE Final    Comment: (NOTE) The Xpert Xpress SARS-CoV-2/FLU/RSV plus assay is intended as an aid in the  diagnosis of influenza from Nasopharyngeal swab specimens and should not be used as a sole basis for treatment. Nasal washings and aspirates are unacceptable for Xpert Xpress SARS-CoV-2/FLU/RSV testing.  Fact Sheet for Patients: BloggerCourse.com  Fact Sheet for Healthcare Providers: SeriousBroker.it  This test is not yet approved or cleared by the [place] FDA and has been authorized for detection and/or diagnosis of SARS-CoV-2 by FDA under an Emergency Use Authorization (EUA). This EUA will remain in effect (meaning this test can be used) for the duration of the COVID-19 declaration under Section 564(b)(1) of the Act, 21 U.S.C. section 360bbb-3(b)(1), unless the authorization is terminated or revoked.  Performed at [facility], [address] [place], [place] [address]      Discharge Instructions:   Discharge Instructions    Diet general   Complete by: As directed    Discharge instructions   Complete by: As directed    Cbc 1 week   Increase activity slowly   Complete by: As directed      Allergies as of 01/06/2021   No Known Allergies     Medication List    TAKE these medications   B-12 PO Take 1 tablet by mouth daily.   iron polysaccharides 150 MG capsule Commonly known as: NIFEREX Take 1 capsule (150 mg total) by mouth daily. Start taking on: January 07, 2021   Multivitamin Adult [place] 1 tablet by mouth daily.   omeprazole 20 MG capsule Commonly known as: PRILOSEC Take 1 capsule (20 mg total) by mouth daily.   ondansetron 4 MG disintegrating tablet Commonly known as: ZOFRAN-ODT Take 1 tablet (4 mg total) by mouth every 8 (eight) hours as needed for nausea or vomiting.       Follow-up Information    [facility] Follow up.   Contact information: [address] [place] [id number] [phone]               Time coordinating  discharge: 35 min  Signed:  Joseph Art DO  [facility] 01/06/2021, 1:46 PM

## 2021-01-06 NOTE — Progress Notes (Signed)
Pt IV removed, cathter intact. Tele removed, ccmd aware. Pt has all belongings and understands d/c instructions. Pt d/c via wheelchair by RN.  

## 2021-02-16 NOTE — Progress Notes (Deleted)
Patient ID: Faith Castillo, female   DOB: 03-13-1976, 45 y.o.   MRN: [id number]   Principal Problem:   Symptomatic anemia Active Problems:   Nausea & vomiting   Weight loss   Essential hypertension   HPI [name] a 45 y.o.femalewith medical history significant ofanemia andhypertensionwith complaints of intermittent nausea and vomiting over the last year. Normally well vomit once or twice per week on average after eating and denies any blood in emesis. Associated symptoms include weight loss of over 30 pounds since October of last year, fatigue, heartburn, and frequent belching. Takes Pepto-Bismol intermittently as needed for symptoms. Denies any fevers, abdominal pain, dysuria, chest pain, shortness of breath, or recent sick contacts. Seen at urgent care yesterday for her symptoms and had been prescribed omeprazole and Zofran as needed for nausea. Blood work had also been obtained at that time and she was called back and told that her hemoglobin was 5.8 and to come to the hospital. Her last menstrual cycle was at the beginning of this month and she normally bleeds heavily. She has no primary care provider and has been intermittently going to urgent care for blood pressure medications of atenolol-hydrochlorothiazide. Routine health screening such as mammogram she is not up-to-date on. Patient does intermittently use NSAIDs reportedly for back pain. She does not drink alcohol, smoke tobacco, or doing illicit drugs.  Hospital Course by problem: Symptomatic anemia: On admission patient was seen to have a hemoglobin of5.7,but relatively unchanged from the previous day. Stool guaiacs were noted to be negative. Patient does admit to having heavy menstrual periods. Suspect iron deficiencygiven the low MCV and MCH. -Continue with transfusion of 2 units of packed red blood cell -s/p Feraheme infusion   Nausea and vomiting: Patientwith reports of intermittent nausea and  vomiting over the last 1 year. Denies any abdominal pain, but did report intermittent belching and heartburn symptoms. She had picked up her prescription for omeprazole which was prescribed by urgent care yesterday. -Advised patient of the need to refrain from use of frequent NSAIDs as this could put her at risk for peptic ulcers   Weight loss: Acute. Patient reports previously weighing around 226 pounds in October 2021, but last weighed herself and was 185 pounds sometime in February. TSH was noted to be within normal limits yesterday. -Transitions of care consult for need of primary care provider to work-up further in outpatient setting -May benefit from referral to gastroenterology  Essential hypertension: Blood pressures currently stable. Patient previously reported being on medications of atenolol-hydrochlorothiazide, but was currently not taking anything. Possibly related with patient's weight loss. -no need for BP meds

## 2021-02-17 ENCOUNTER — Inpatient Hospital Stay: Payer: Self-pay | Admitting: Physician Assistant

## 2021-12-16 ENCOUNTER — Encounter (HOSPITAL_COMMUNITY): Payer: Self-pay

## 2021-12-16 ENCOUNTER — Other Ambulatory Visit: Payer: Self-pay

## 2021-12-16 ENCOUNTER — Ambulatory Visit (HOSPITAL_COMMUNITY)
Admission: EM | Admit: 2021-12-16 | Discharge: 2021-12-16 | Disposition: A | Discharge status: Home / Self Care | Payer: Self-pay | Attending: Internal Medicine | Admitting: Internal Medicine

## 2021-12-16 DIAGNOSIS — M549 Dorsalgia, unspecified: Secondary | ICD-10-CM

## 2021-12-16 DIAGNOSIS — K2900 Acute gastritis without bleeding: Secondary | ICD-10-CM

## 2021-12-16 DIAGNOSIS — G8929 Other chronic pain: Secondary | ICD-10-CM

## 2021-12-16 MED ORDER — PANTOPRAZOLE SODIUM 20 MG PO TBEC: 20.0000 mg | DELAYED_RELEASE_TABLET | Freq: Every day | ORAL | 1 refills | Status: DC

## 2021-12-16 MED ORDER — ONDANSETRON 4 MG PO TBDP: 4.0000 mg | ORAL_TABLET | Freq: Three times a day (TID) | ORAL | 0 refills | Status: DC | PRN

## 2021-12-16 MED ORDER — METHOCARBAMOL 500 MG PO TABS: 500.0000 mg | ORAL_TABLET | Freq: Every evening | ORAL | 0 refills | Status: AC | PRN

## 2021-12-16 NOTE — ED Provider Notes (Addendum)
?[facility] ? ? ? ?CSN: [id number] ?Arrival date & time: 12/16/21  1537 ? ? ?  ? ?History   ?Chief Complaint ?Chief Complaint  ?Patient presents with  ? Abdominal Pain  ? Emesis  ? Diarrhea  ? ? ?HPI ?Faith Castillo is a 46 y.o. female comes to the urgent care with 1 week history of epigastric abdominal pain, abdominal bloating, vomiting and diarrhea.  Patient's symptoms started a week ago and has been persistent.  She endorses worsening pain with food intake.  Pain is of moderate severity.  She endorses taking [facility] and ibuprofen on a regular basis.  She was previously treated with omeprazole.  She had significant improvement in her symptoms when she was on omeprazole.  She she stopped taking omeprazole several months ago.  No weight loss.  No dark or melanotic stools. ? ?Patient has a history of lower back pain.  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  She endorses frequent heavy lifting.  Pain is aggravated by heavy lifting.  She has been taking Goody BC and ibuprofen on a regular basis for back pain. ? ?HPI ? ?Past Medical History:  ?Diagnosis Date  ? Hypertension   ? ? ?Patient Active Problem List  ? Diagnosis Date Noted  ? Symptomatic anemia 01/05/2021  ? Nausea & vomiting 01/05/2021  ? Weight loss 01/05/2021  ? Essential hypertension 01/05/2021  ? ? ?History reviewed. No pertinent surgical history. ? ?OB History   ?No obstetric history on file. ?  ? ? ? ?Home Medications   ? ?Prior to Admission medications   ?Medication Sig Start Date End Date Taking? Authorizing Provider  ?methocarbamol (ROBAXIN) 500 MG tablet Take 1 tablet (500 mg total) by mouth at bedtime as needed for muscle spasms. 12/16/21  Yes Jontez Redfield, Britta Mccreedy, MD  ?ondansetron (ZOFRAN-ODT) 4 MG disintegrating tablet Take 1 tablet (4 mg total) by mouth every 8 (eight) hours as needed for nausea or vomiting. 12/16/21  Yes Chevis Weisensel, Britta Mccreedy, MD  ?pantoprazole (PROTONIX) 20 MG tablet Take 1 tablet (20 mg total) by mouth daily.  12/16/21  Yes Terron Merfeld, Britta Mccreedy, MD  ?Cyanocobalamin (B-12 PO) Take 1 tablet by mouth daily.    [provider]  ?iron polysaccharides (NIFEREX) 150 MG capsule Take 1 capsule (150 mg total) by mouth daily. 01/07/21   Joseph Art, DO  ?Multiple Vitamins-Minerals (MULTIVITAMIN ADULT) CHEW Chew 1 tablet by mouth daily.    [provider]  ? ? ?Family History ?Family History  ?Problem Relation Age of Onset  ? Hypertension Mother   ? Hypertension Father   ? ? ?Social History ?Social History  ? ?Tobacco Use  ? Smoking status: Never  ? Smokeless tobacco: Never  ?Vaping Use  ? Vaping Use: Never used  ?Substance Use Topics  ? Alcohol use: No  ? Drug use: No  ? ? ? ?Allergies   ?Patient has no known allergies. ? ? ?Review of Systems ?Review of Systems  ?HENT: Negative.    ?Gastrointestinal:  Positive for abdominal pain, diarrhea, nausea and vomiting. Negative for abdominal distention.  ? ? ?Physical Exam ?Triage Vital Signs ?ED Triage Vitals [12/16/21 1559]  ?Enc Vitals Group  ?   BP (!) 159/102  ?   Pulse Rate 94  ?   Resp 16  ?   Temp 98.9 ?F (37.2 ?C)  ?   Temp Source Oral  ?   SpO2 100 %  ?   Weight   ?  Height   ?   Head Circumference   ?   Peak Flow   ?   Pain Score   ?   Pain Loc   ?   Pain Edu?   ?   Excl. in GC?   ? ?No data found. ? ?Updated Vital Signs ?BP (!) 159/102 (BP Location: Left Arm)   Pulse 94   Temp 98.9 ?F (37.2 ?C) (Oral)   Resp 16   LMP 12/11/2021   SpO2 100%  ? ?Visual Acuity ?Right Eye Distance:   ?Left Eye Distance:   ?Bilateral Distance:   ? ?Right Eye Near:   ?Left Eye Near:    ?Bilateral Near:    ? ?Physical Exam ?Vitals and nursing note reviewed.  ?Constitutional:   ?   General: She is not in acute distress. ?   Appearance: She is not ill-appearing.  ?Abdominal:  ?   General: Abdomen is flat. There is no distension.  ?   Palpations: Abdomen is soft. There is no hepatomegaly or splenomegaly.  ?   Tenderness: There is abdominal tenderness in the epigastric area. There is  no guarding or rebound.  ?   Hernia: No hernia is present.  ?Neurological:  ?   Mental Status: She is alert.  ? ? ? ?UC Treatments / Results  ?Labs ?(all labs ordered are listed, but only abnormal results are displayed) ?Labs Reviewed - No data to display ? ?EKG ? ? ?Radiology ?No results found. ? ?Procedures ?Procedures (including critical care time) ? ?Medications Ordered in UC ?Medications - No data to display ? ?Initial Impression / Assessment and Plan / UC Course  ?I have reviewed the triage vital signs and the nursing notes. ? ?Pertinent labs & imaging results that were available during my care of the patient were reviewed by me and considered in my medical decision making (see chart for details). ? ?  ? ?1.  Acute gastritis without hemorrhage: ?Start patient on Protonix ?Patient advised to stop taking [facility] and ibuprofen ?Conjunctival membranes are pink as patient is not anemic ?Return precautions given ? ?2.  Acute on chronic back pain: ?Tylenol arthritis as needed for pain ?Robaxin at bedtime as needed for muscle spasms-precautions given. ?Patient is advised to stop taking [facility] or NSAIDs. ?Final Clinical Impressions(s) / UC Diagnoses  ? ?Final diagnoses:  ?Acute superficial gastritis without hemorrhage  ?Exacerbation of chronic back pain  ? ? ? ?Discharge Instructions   ? ?  ?Increase oral fluid intake ?Avoid taking Goody powder and NSAIDs ?Take Tylenol Extra Strength for back pain as well as the muscle relaxant prescribed ?Avoid driving or operating heavy machinery if you take muscle relaxants. ?Return to urgent care if you have persistent vomiting, bloody stools or black stools. ?Heating pad use on a 20-minute on-20 minutes off cycle. ?Gentle stretches to help with back pain. ? ? ?ED Prescriptions   ? ? Medication Sig Dispense Auth. Provider  ? ondansetron (ZOFRAN-ODT) 4 MG disintegrating tablet Take 1 tablet (4 mg total) by mouth every 8 (eight) hours as needed for nausea or vomiting. 30 tablet  Guiliana Shor, Britta Mccreedy, MD  ? pantoprazole (PROTONIX) 20 MG tablet Take 1 tablet (20 mg total) by mouth daily. 60 tablet Eesha Schmaltz, Britta Mccreedy, MD  ? methocarbamol (ROBAXIN) 500 MG tablet Take 1 tablet (500 mg total) by mouth at bedtime as needed for muscle spasms. 20 tablet Freja Faro, Britta Mccreedy, MD  ? ?  ? ?PDMP not reviewed this encounter. ?  ?Talani Brazee,  Britta Mccreedy, MD ?12/16/21 1655 ? ?  ?Merrilee Jansky, MD ?12/16/21 1657 ? ?

## 2021-12-16 NOTE — ED Triage Notes (Signed)
Pt presents to the office for abdominal pain, nausea and vomiting x 1 week.  ?

## 2021-12-16 NOTE — Discharge Instructions (Addendum)
Increase oral fluid intake ?Avoid taking Goody powder and NSAIDs ?Take Tylenol Extra Strength for back pain as well as the muscle relaxant prescribed ?Avoid driving or operating heavy machinery if you take muscle relaxants. ?Return to urgent care if you have persistent vomiting, bloody stools or black stools. ?Heating pad use on a 20-minute on-20 minutes off cycle. ?Gentle stretches to help with back pain. ?

## 2022-06-04 ENCOUNTER — Other Ambulatory Visit: Payer: Self-pay

## 2022-06-04 ENCOUNTER — Emergency Department (HOSPITAL_COMMUNITY)
Admission: EM | Admit: 2022-06-04 | Discharge: 2022-06-05 | Disposition: A | Discharge status: Home / Self Care | Payer: Self-pay | Attending: Emergency Medicine | Admitting: Emergency Medicine

## 2022-06-04 DIAGNOSIS — R630 Anorexia: Secondary | ICD-10-CM | POA: Insufficient documentation

## 2022-06-04 DIAGNOSIS — R112 Nausea with vomiting, unspecified: Secondary | ICD-10-CM | POA: Insufficient documentation

## 2022-06-04 DIAGNOSIS — R109 Unspecified abdominal pain: Secondary | ICD-10-CM

## 2022-06-04 DIAGNOSIS — R1033 Periumbilical pain: Secondary | ICD-10-CM | POA: Insufficient documentation

## 2022-06-04 DIAGNOSIS — N9489 Other specified conditions associated with female genital organs and menstrual cycle: Secondary | ICD-10-CM | POA: Insufficient documentation

## 2022-06-04 DIAGNOSIS — I10 Essential (primary) hypertension: Secondary | ICD-10-CM | POA: Insufficient documentation

## 2022-06-04 DIAGNOSIS — R1013 Epigastric pain: Secondary | ICD-10-CM | POA: Insufficient documentation

## 2022-06-04 LAB — CBC WITH DIFFERENTIAL/PLATELET
Abs Immature Granulocytes: 0.01 10*3/uL (ref 0.00–0.07)
Basophils Absolute: 0 10*3/uL (ref 0.0–0.1)
Basophils Relative: 0 %
Eosinophils Absolute: 0.1 10*3/uL (ref 0.0–0.5)
Eosinophils Relative: 2 %
HCT: 29.3 % — ABNORMAL LOW (ref 36.0–46.0)
Hemoglobin: 8.4 g/dL — ABNORMAL LOW (ref 12.0–15.0)
Immature Granulocytes: 0 %
Lymphocytes Relative: 30 %
Lymphs Abs: 1.9 10*3/uL (ref 0.7–4.0)
MCH: 19.9 pg — ABNORMAL LOW (ref 26.0–34.0)
MCHC: 28.7 g/dL — ABNORMAL LOW (ref 30.0–36.0)
MCV: 69.4 fL — ABNORMAL LOW (ref 80.0–100.0)
Monocytes Absolute: 0.7 10*3/uL (ref 0.1–1.0)
Monocytes Relative: 11 %
Neutro Abs: 3.5 10*3/uL (ref 1.7–7.7)
Neutrophils Relative %: 57 %
Platelets: 526 10*3/uL — ABNORMAL HIGH (ref 150–400)
RBC: 4.22 MIL/uL (ref 3.87–5.11)
RDW: 16.9 % — ABNORMAL HIGH (ref 11.5–15.5)
WBC: 6.3 10*3/uL (ref 4.0–10.5)
nRBC: 0 % (ref 0.0–0.2)

## 2022-06-04 LAB — COMPREHENSIVE METABOLIC PANEL
ALT: 10 U/L (ref 0–44)
AST: 14 U/L — ABNORMAL LOW (ref 15–41)
Albumin: 3.8 g/dL (ref 3.5–5.0)
Alkaline Phosphatase: 52 U/L (ref 38–126)
Anion gap: 8 (ref 5–15)
BUN: 11 mg/dL (ref 6–20)
CO2: 24 mmol/L (ref 22–32)
Calcium: 9.3 mg/dL (ref 8.9–10.3)
Chloride: 105 mmol/L (ref 98–111)
Creatinine, Ser: 0.68 mg/dL (ref 0.44–1.00)
GFR, Estimated: >60 mL/min (ref >60)
Glucose, Bld: 105 mg/dL — ABNORMAL HIGH (ref 70–99)
Potassium: 3 mmol/L — ABNORMAL LOW (ref 3.5–5.1)
Sodium: 137 mmol/L (ref 135–145)
Total Bilirubin: 0.6 mg/dL (ref 0.3–1.2)
Total Protein: 8 g/dL (ref 6.5–8.1)

## 2022-06-04 LAB — LIPASE, BLOOD: Lipase: 25 U/L (ref 11–51)

## 2022-06-04 NOTE — ED Triage Notes (Signed)
Pt here from home for non-radiating upper abd pain w/ N/V x1 week. Pt reports constipation, does not know last BM. Pt reports weight loss, decreased appetite.

## 2022-06-04 NOTE — ED Provider Triage Note (Signed)
Emergency Medicine Provider Triage Evaluation Note  NAYA ILAGAN , a 46 y.o. female  was evaluated in triage.  Pt complains of upper abdominal pain ongoing for about a week.  Associated with nausea vomiting.  Endorses constipation.  Denies melanotic stools, hematemesis.  Denies chest pain, shortness of breath  Review of Systems  Positive: As above Negative: As above  Physical Exam  BP (!) 141/98 (BP Location: Left Arm)   Pulse (!) 107   Temp 98.6 F (37 C) (Oral)   Resp 15   LMP 05/13/2022 (Exact Date)   SpO2 99%  Gen:   Awake, no distress  Resp:  Normal effort  MSK:   Moves extremities without difficulty  Other:  Generalized abdominal tenderness present.  Without guarding.  Medical Decision Making  Medically screening exam initiated at 5:07 PM.  Appropriate orders placed.  MAKENZE ELLETT was informed that the remainder of the evaluation will be completed by another provider, this initial triage assessment does not replace that evaluation, and the importance of remaining in the ED until their evaluation is complete.     Marita Kansas, PA-C 06/04/22 1708

## 2022-06-05 ENCOUNTER — Emergency Department (HOSPITAL_COMMUNITY): Payer: Self-pay

## 2022-06-05 ENCOUNTER — Encounter (HOSPITAL_COMMUNITY): Payer: Self-pay | Admitting: Student

## 2022-06-05 LAB — I-STAT BETA HCG BLOOD, ED (MC, WL, AP ONLY): I-stat hCG, quantitative: <5 m[IU]/mL (ref <5)

## 2022-06-05 MED ORDER — PANTOPRAZOLE SODIUM 40 MG PO TBEC: 40.0000 mg | DELAYED_RELEASE_TABLET | Freq: Every day | ORAL | 0 refills | Status: DC

## 2022-06-05 MED ORDER — SODIUM CHLORIDE 0.9 % IV BOLUS
1000.0000 mL | Freq: Once | INTRAVENOUS | Status: AC
  Administered 2022-06-05: 1000 mL via INTRAVENOUS

## 2022-06-05 MED ORDER — IOHEXOL 300 MG/ML  SOLN
100.0000 mL | Freq: Once | INTRAMUSCULAR | Status: AC | PRN
  Administered 2022-06-05: 100 mL via INTRAVENOUS

## 2022-06-05 MED ORDER — ONDANSETRON HCL 4 MG/2ML IJ SOLN
4.0000 mg | Freq: Once | INTRAMUSCULAR | Status: AC
  Administered 2022-06-05: 4 mg via INTRAVENOUS
  Filled 2022-06-05: qty 2

## 2022-06-05 MED ORDER — ONDANSETRON 4 MG PO TBDP
4.0000 mg | ORAL_TABLET | Freq: Three times a day (TID) | ORAL | 0 refills | Status: DC | PRN
Start: 2022-06-05 — End: 2023-06-22

## 2022-06-05 MED ORDER — SUCRALFATE 1 GM/10ML PO SUSP: 1.0000 g | Freq: Three times a day (TID) | ORAL | 0 refills | Status: DC

## 2022-06-05 MED ORDER — PANTOPRAZOLE SODIUM 40 MG IV SOLR
40.0000 mg | Freq: Once | INTRAVENOUS | Status: AC
  Administered 2022-06-05: 40 mg via INTRAVENOUS
  Filled 2022-06-05: qty 10

## 2022-06-05 MED ORDER — POTASSIUM CHLORIDE CRYS ER 20 MEQ PO TBCR: 20.0000 meq | EXTENDED_RELEASE_TABLET | Freq: Every day | ORAL | 0 refills | Status: DC

## 2022-06-05 MED ORDER — MORPHINE SULFATE (PF) 4 MG/ML IV SOLN
4.0000 mg | Freq: Once | INTRAVENOUS | Status: AC
  Administered 2022-06-05: 4 mg via INTRAVENOUS
  Filled 2022-06-05: qty 1

## 2022-06-05 NOTE — ED Provider Notes (Signed)
Care of patient handed off to me by Petrucelli, PA-C at shift change. Please see their note for full HPI, work-up. Briefly, this is a 46 year old female with past medical history of hypertension who presents to the emergency department for abdominal pain, nausea and vomiting for 1 week. Has been intermittent, associated with anorexia. No bowel movement for 1 week ago. Still having flatulence. No fevers, dysuria, vaginal discharge, hematemesis, hematochezia or melena. No previous abdominal surgeries.   Physical Exam  BP (!) 149/87 (BP Location: Left Arm)   Pulse 87   Temp 98.6 F (37 C) (Oral)   Resp (!) 21   LMP 05/13/2022 (Exact Date)   SpO2 100%   Physical Exam Vitals and nursing note reviewed.  Constitutional:      General: She is not in acute distress. HENT:     Head: Normocephalic and atraumatic.  Eyes:     General: No scleral icterus. Pulmonary:     Effort: Pulmonary effort is normal. No respiratory distress.  Abdominal:     Tenderness: There is abdominal tenderness in the periumbilical area. There is no rebound.  Skin:    General: Skin is warm and dry.     Findings: No rash.  Neurological:     General: No focal deficit present.     Mental Status: She is alert and oriented to person, place, and time.  Psychiatric:        Mood and Affect: Mood normal.        Behavior: Behavior normal.        Thought Content: Thought content normal.        Judgment: Judgment normal.    Procedures  Procedures  ED Course / MDM    Medical Decision Making Amount and/or Complexity of Data Reviewed Radiology: ordered.  Risk Prescription drug management.   Labs obtained overnight include CBC with anemia to 8.4, stable. CMP is notable for hypokalemia -> given PO replacement. Otherwise within normal limits. Lipase is normal. Not pregnant. UA pending. EKG without ischemia or infarction  Given 1L IVF, Zofran, Protonix and morphine by previous provider with improvement in her symptoms. CT  Abdomen pelvis with contrast shows no acute findings PO trial passed in ED   Patient given Rx for Zofran, Protonix, Carafate, Potassium replacement. She is instructed to have her potassium level rechecked by her PCP in 1-2 weeks. Given return precautions for worsening symptoms, otherwise PCP Follow-up for re-evaluation. She verbalizes understanding.  Discharge        Cristopher Peru, PA-C 06/05/22 0729    Phoebe Sharps, DO 06/05/22 [phone]

## 2022-06-05 NOTE — ED Provider Notes (Signed)
[facility] EMERGENCY DEPARTMENT Provider Note   CSN: [id number] Arrival date & time: 06/04/22  1510     History  Chief Complaint  Patient presents with   Abdominal Pain    Faith Castillo is a 46 y.o. female  with a hx of hypertension and anemia who presents to the ED with complaints of abdominal pain for the past 1 week.  Pain occurs intermittently to the epigastrium/periumbilical region, sharp in nature, somewhat aggravated by p.o. intake but has overall had poor appetite.  No alleviating factors.  Associated nausea and vomiting.  Last bowel movement was about a week ago, she is still passing gas.  She denies fever, chills, dysuria, melena, hematochezia, hematemesis, vaginal bleeding, or vaginal discharge.  No prior abdominal surgeries.  HPI     Home Medications Prior to Admission medications   Medication Sig Start Date End Date Taking? Authorizing Provider  Cyanocobalamin (B-12 PO) Take 1 tablet by mouth daily.    [provider]  iron polysaccharides (NIFEREX) 150 MG capsule Take 1 capsule (150 mg total) by mouth daily. 01/07/21   Joseph Art, DO  methocarbamol (ROBAXIN) 500 MG tablet Take 1 tablet (500 mg total) by mouth at bedtime as needed for muscle spasms. 12/16/21   Merrilee Jansky, MD  Multiple Vitamins-Minerals (MULTIVITAMIN ADULT) CHEW Chew 1 tablet by mouth daily.    [provider]  ondansetron (ZOFRAN-ODT) 4 MG disintegrating tablet Take 1 tablet (4 mg total) by mouth every 8 (eight) hours as needed for nausea or vomiting. 12/16/21   Merrilee Jansky, MD  pantoprazole (PROTONIX) 20 MG tablet Take 1 tablet (20 mg total) by mouth daily. 12/16/21   Lamptey, Britta Mccreedy, MD      Allergies    Patient has no known allergies.    Review of Systems   Review of Systems  Constitutional:  Positive for appetite change. Negative for chills and fever.  Respiratory:  Negative for shortness of breath.   Cardiovascular:  Negative for chest pain.   Gastrointestinal:  Positive for abdominal pain, constipation, nausea and vomiting. Negative for blood in stool and diarrhea.  Genitourinary:  Negative for dysuria, vaginal bleeding and vaginal discharge.  All other systems reviewed and are negative.   Physical Exam Updated Vital Signs BP (!) 157/106 (BP Location: Left Arm)   Pulse 97   Temp 98.3 F (36.8 C) (Oral)   Resp 18   LMP 05/13/2022 (Exact Date)   SpO2 99%  Physical Exam Vitals and nursing note reviewed.  Constitutional:      General: She is not in acute distress.    Appearance: She is well-developed. She is not toxic-appearing.  HENT:     Head: Normocephalic and atraumatic.  Eyes:     General:        Right eye: No discharge.        Left eye: No discharge.     Conjunctiva/sclera: Conjunctivae normal.  Cardiovascular:     Rate and Rhythm: Normal rate and regular rhythm.  Pulmonary:     Effort: No respiratory distress.     Breath sounds: Normal breath sounds. No wheezing or rales.  Abdominal:     General: There is no distension.     Palpations: Abdomen is soft.     Tenderness: There is abdominal tenderness in the epigastric area and periumbilical area. There is no guarding or rebound. Negative signs include Murphy's sign.  Musculoskeletal:     Cervical back: Neck supple.  Skin:  General: Skin is warm and dry.  Neurological:     Mental Status: She is alert.     Comments: Clear speech.   Psychiatric:        Behavior: Behavior normal.     ED Results / Procedures / Treatments   Labs (all labs ordered are listed, but only abnormal results are displayed) Labs Reviewed  CBC WITH DIFFERENTIAL/PLATELET - Abnormal; Notable for the following components:      Result Value   Hemoglobin 8.4 (*)    HCT 29.3 (*)    MCV 69.4 (*)    MCH 19.9 (*)    MCHC 28.7 (*)    RDW 16.9 (*)    Platelets 526 (*)    All other components within normal limits  COMPREHENSIVE METABOLIC PANEL - Abnormal; Notable for the following  components:   Potassium 3.0 (*)    Glucose, Bld 105 (*)    AST 14 (*)    All other components within normal limits  LIPASE, BLOOD  URINALYSIS, ROUTINE W REFLEX MICROSCOPIC    EKG None  Radiology No results found.  Procedures Procedures    Medications Ordered in ED Medications - No data to display  ED Course/ Medical Decision Making/ A&P                           Medical Decision Making Amount and/or Complexity of Data Reviewed Radiology: ordered.  Risk Prescription drug management.   Patient presents to the ED with complaints of abdominal pain. Patient nontoxic appearing, in no apparent distress, vitals w/ elevated BP- doubt HTN emergency, initial tachycardia resolved. On exam patient tender to the epigastrium & periumbilical region, no peritoneal signs.   Ddx including but not limited to: GERD, PUD, perforation, obstruction, cholecystitis, cholelithiasis, choledocholithiasis, pancreatitis, appendicitis, diverticulitis, constipation, viral GI illness.  Additional history obtained:  Additional history obtained from chart review & nursing note review.   Lab Tests:  I viewed and interpreted labs, which included:  CBC: anemia similar to prior CMP: hypokalemia Lipase: wnl Preg test: Negative  Ordered morphine for pain, Zofran for nausea, and Protonix as an antiacid as well as fluids for hydration.  Plan for CT abdomen/pelvis with contrast for further assessment.  06:30: Patient care signed out to PA Autry @ shift change pending CT, re-assessment & disposition.   Portions of this note were generated with Scientist, clinical (histocompatibility and immunogenetics). Dictation errors may occur despite best attempts at proofreading.   Final Clinical Impression(s) / ED Diagnoses Final diagnoses:  Abdominal pain, unspecified abdominal location  Nausea and vomiting, unspecified vomiting type    Rx / DC Orders ED Discharge Orders     None         Cherly Anderson, PA-C 06/05/22 0630     Tilden Fossa, MD 06/06/22 [phone]

## 2022-06-05 NOTE — ED Notes (Signed)
PO Challenge underway.

## 2022-06-05 NOTE — Discharge Instructions (Addendum)
You were seen in the emergency department today for abdominal pain.  Your labs showed your anemia which is similar to prior.  Your labs also showed a low potassium level, please see attached diet guidelines to include potassium rich foods in your diet and take the potassium supplement that we prescribed over the next few days.  Please have your potassium level rechecked by your primary care provider in 1 to 2 weeks.  We are sending you home with the following medications to help with your symptoms:  - Protonix- please take 1 tablet in the morning prior to any meals to help with stomach acidity/pain.  - Carafate- please take prior to each meal and prior to bedtime to help with stomach acidity/pain.  - Zofran- please take every 8 hours as needed for nausea/vomiting.   We have prescribed you new medication(s) today. Discuss the medications prescribed today with your pharmacist as they can have adverse effects and interactions with your other medicines including over the counter and prescribed medications. Seek medical evaluation if you start to experience new or abnormal symptoms after taking one of these medicines, seek care immediately if you start to experience difficulty breathing, feeling of your throat closing, facial swelling, or rash as these could be indications of a more serious allergic reaction  Please follow attached diet guidelines.   Follow up with your primary care provider within 3 days for re-evaluation.  Return to the ER for new or worsening symptoms including but not limited to worsened pain, new pain, inability to keep fluids down, blood in vomit/stool, passing out, or any other concerns.

## 2023-04-10 ENCOUNTER — Encounter: Payer: Self-pay | Admitting: Physician Assistant

## 2023-04-15 ENCOUNTER — Encounter (HOSPITAL_COMMUNITY): Payer: Self-pay

## 2023-04-15 ENCOUNTER — Emergency Department (HOSPITAL_COMMUNITY)
Admission: EM | Admit: 2023-04-15 | Discharge: 2023-04-15 | Disposition: A | Discharge status: Home / Self Care | Payer: BC Managed Care – PPO | Attending: Emergency Medicine | Admitting: Emergency Medicine

## 2023-04-15 ENCOUNTER — Other Ambulatory Visit: Payer: Self-pay

## 2023-04-15 DIAGNOSIS — R112 Nausea with vomiting, unspecified: Secondary | ICD-10-CM | POA: Diagnosis not present

## 2023-04-15 DIAGNOSIS — N390 Urinary tract infection, site not specified: Secondary | ICD-10-CM | POA: Insufficient documentation

## 2023-04-15 LAB — COMPREHENSIVE METABOLIC PANEL
ALT: 9 U/L (ref 0–44)
AST: 13 U/L — ABNORMAL LOW (ref 15–41)
Albumin: 3.5 g/dL (ref 3.5–5.0)
Alkaline Phosphatase: 52 U/L (ref 38–126)
Anion gap: 9 (ref 5–15)
BUN: 12 mg/dL (ref 6–20)
CO2: 25 mmol/L (ref 22–32)
Calcium: 8.9 mg/dL (ref 8.9–10.3)
Chloride: 102 mmol/L (ref 98–111)
Creatinine, Ser: 0.62 mg/dL (ref 0.44–1.00)
GFR, Estimated: >60 mL/min (ref >60)
Glucose, Bld: 107 mg/dL — ABNORMAL HIGH (ref 70–99)
Potassium: 3.2 mmol/L — ABNORMAL LOW (ref 3.5–5.1)
Sodium: 136 mmol/L (ref 135–145)
Total Bilirubin: 0.6 mg/dL (ref 0.3–1.2)
Total Protein: 7.5 g/dL (ref 6.5–8.1)

## 2023-04-15 LAB — CBC
HCT: 26 % — ABNORMAL LOW (ref 36.0–46.0)
Hemoglobin: 7.3 g/dL — ABNORMAL LOW (ref 12.0–15.0)
MCH: 19.2 pg — ABNORMAL LOW (ref 26.0–34.0)
MCHC: 28.1 g/dL — ABNORMAL LOW (ref 30.0–36.0)
MCV: 68.4 fL — ABNORMAL LOW (ref 80.0–100.0)
Platelets: 626 10*3/uL — ABNORMAL HIGH (ref 150–400)
RBC: 3.8 MIL/uL — ABNORMAL LOW (ref 3.87–5.11)
RDW: 17.9 % — ABNORMAL HIGH (ref 11.5–15.5)
WBC: 8.2 10*3/uL (ref 4.0–10.5)
nRBC: 0 % (ref 0.0–0.2)

## 2023-04-15 LAB — URINALYSIS, ROUTINE W REFLEX MICROSCOPIC
Bilirubin Urine: NEGATIVE
Glucose, UA: NEGATIVE mg/dL
Hgb urine dipstick: NEGATIVE
Ketones, ur: 20 mg/dL — AB
Nitrite: POSITIVE — AB
Protein, ur: NEGATIVE mg/dL
Specific Gravity, Urine: 1.012 (ref 1.005–1.030)
pH: 7 (ref 5.0–8.0)

## 2023-04-15 LAB — HCG, SERUM, QUALITATIVE: Preg, Serum: NEGATIVE

## 2023-04-15 LAB — LIPASE, BLOOD: Lipase: 28 U/L (ref 11–51)

## 2023-04-15 MED ORDER — POTASSIUM CHLORIDE CRYS ER 20 MEQ PO TBCR
40.0000 meq | EXTENDED_RELEASE_TABLET | Freq: Once | ORAL | Status: AC
  Administered 2023-04-15: 40 meq via ORAL
  Filled 2023-04-15: qty 2

## 2023-04-15 MED ORDER — ONDANSETRON HCL 4 MG/2ML IJ SOLN
4.0000 mg | Freq: Once | INTRAMUSCULAR | Status: AC
  Administered 2023-04-15: 4 mg via INTRAVENOUS
  Filled 2023-04-15: qty 2

## 2023-04-15 MED ORDER — FERROUS SULFATE 325 (65 FE) MG PO TABS: 325.0000 mg | ORAL_TABLET | Freq: Every day | ORAL | 1 refills | Status: DC

## 2023-04-15 MED ORDER — CEPHALEXIN 500 MG PO CAPS: 500.0000 mg | ORAL_CAPSULE | Freq: Four times a day (QID) | ORAL | 0 refills | Status: DC

## 2023-04-15 MED ORDER — SODIUM CHLORIDE 0.9 % IV SOLN
2.0000 g | Freq: Once | INTRAVENOUS | Status: AC
  Administered 2023-04-15: 2 g via INTRAVENOUS
  Filled 2023-04-15: qty 20

## 2023-04-15 MED ORDER — ONDANSETRON HCL 4 MG PO TABS: 4.0000 mg | ORAL_TABLET | Freq: Four times a day (QID) | ORAL | 0 refills | Status: DC | PRN

## 2023-04-15 MED ORDER — SODIUM CHLORIDE 0.9 % IV BOLUS
1000.0000 mL | Freq: Once | INTRAVENOUS | Status: AC
  Administered 2023-04-15: 1000 mL via INTRAVENOUS

## 2023-04-15 NOTE — ED Triage Notes (Signed)
Pt c/o N/V and loss of appetitex2-54yrs.

## 2023-04-15 NOTE — ED Provider Notes (Signed)
[place] EMERGENCY DEPARTMENT AT [facility] Provider Note   CSN: [id number] Arrival date & time: 04/15/23  1523     History  Chief Complaint  Patient presents with   Emesis    Faith Castillo is a 47 y.o. female with medical history of hypertension.  She presents to the ED for evaluation of nausea, vomiting and abdominal pain.  Patient reports that the symptoms been ongoing for the last 2 or 3 years.  She states that she has not seen a PCP for this, she has tried to get into see [facility] however they have not returned her call over the last 2 years she states.  When asked what about her condition has changed and made her seek emergency medical care today, she reports that she is "just tired of feeling sick".  The patient states that she is having nausea and vomiting.  She reports that she last had an episode of vomiting this morning.  She states that she took her last Zofran this morning which was prescribed to her by previous provider that she saw for the same issue.  She states that over the last 2 or 3 years she is gone from about 210 pounds down to 130.  She denies any change in her bowel habits.  She states that her last bowel movement was this morning.  She denies diarrhea.  She denies urinary symptoms, fevers, flank pain, vaginal discharge, chest pain, shortness of breath.  She denies blood in her stool or blood in her vomit.  She denies marijuana use.  The patient denies lightheadedness or dizziness, she does endorse weakness.   Emesis Associated symptoms: abdominal pain        Home Medications Prior to Admission medications   Medication Sig Start Date End Date Taking? Authorizing Provider  cephALEXin (KEFLEX) 500 MG capsule Take 1 capsule (500 mg total) by mouth 4 (four) times daily. 04/15/23  Yes Al Decant, PA-C  ferrous sulfate 325 (65 FE) MG tablet Take 1 tablet (325 mg total) by mouth daily. 04/15/23  Yes Al Decant, PA-C   ondansetron (ZOFRAN) 4 MG tablet Take 1 tablet (4 mg total) by mouth every 6 (six) hours as needed for nausea or vomiting. 04/15/23  Yes Al Decant, PA-C  Cyanocobalamin (B-12 PO) Take 1 tablet by mouth daily.    [provider]  iron polysaccharides (NIFEREX) 150 MG capsule Take 1 capsule (150 mg total) by mouth daily. 01/07/21   Joseph Art, DO  methocarbamol (ROBAXIN) 500 MG tablet Take 1 tablet (500 mg total) by mouth at bedtime as needed for muscle spasms. 12/16/21   Merrilee Jansky, MD  Multiple Vitamins-Minerals (MULTIVITAMIN ADULT) CHEW Chew 1 tablet by mouth daily.    [provider]  ondansetron (ZOFRAN-ODT) 4 MG disintegrating tablet Take 1 tablet (4 mg total) by mouth every 8 (eight) hours as needed for nausea or vomiting. 06/05/22   Petrucelli, Samantha R, PA-C  pantoprazole (PROTONIX) 40 MG tablet Take 1 tablet (40 mg total) by mouth daily. 06/05/22   Petrucelli, Samantha R, PA-C  potassium chloride SA (KLOR-CON M) 20 MEQ tablet Take 1 tablet (20 mEq total) by mouth daily for 3 days. 06/05/22 06/08/22  Petrucelli, Samantha R, PA-C  sucralfate (CARAFATE) 1 GM/10ML suspension Take 10 mLs (1 g total) by mouth 4 (four) times daily -  with meals and at bedtime. 06/05/22   Petrucelli, Pleas Koch, PA-C      Allergies  Patient has no known allergies.    Review of Systems   Review of Systems  Constitutional:  Positive for appetite change.  Gastrointestinal:  Positive for abdominal pain, nausea and vomiting.  All other systems reviewed and are negative.   Physical Exam Updated Vital Signs BP (!) 157/89 (BP Location: Left Arm)   Pulse 76   Temp 98 F (36.7 C)   Resp 16   Ht 5\' 6"  (1.676 m)   Wt 79.5 kg   SpO2 100%   BMI 28.29 kg/m  Physical Exam Vitals and nursing note reviewed.  Constitutional:      General: She is not in acute distress.    Appearance: Normal appearance. She is not ill-appearing, toxic-appearing or diaphoretic.  HENT:     Head:  Normocephalic and atraumatic.     Nose: Nose normal.     Mouth/Throat:     Mouth: Mucous membranes are moist.     Pharynx: Oropharynx is clear.  Eyes:     Extraocular Movements: Extraocular movements intact.     Conjunctiva/sclera: Conjunctivae normal.     Pupils: Pupils are equal, round, and reactive to light.  Cardiovascular:     Rate and Rhythm: Normal rate and regular rhythm.  Pulmonary:     Effort: Pulmonary effort is normal.     Breath sounds: Normal breath sounds. No wheezing.  Abdominal:     General: Abdomen is flat. Bowel sounds are normal.     Palpations: Abdomen is soft.     Tenderness: There is no abdominal tenderness.     Comments: No reaction elicited, no rebound or guarding  Musculoskeletal:     Cervical back: Normal range of motion and neck supple.  Skin:    General: Skin is warm and dry.     Capillary Refill: Capillary refill takes less than 2 seconds.  Neurological:     General: No focal deficit present.     Mental Status: She is alert and oriented to person, place, and time.     GCS: GCS eye subscore is 4. GCS verbal subscore is 5. GCS motor subscore is 6.     Cranial Nerves: Cranial nerves 2-12 are intact.     Sensory: Sensation is intact.     Motor: Motor function is intact. No weakness.     ED Results / Procedures / Treatments   Labs (all labs ordered are listed, but only abnormal results are displayed) Labs Reviewed  COMPREHENSIVE METABOLIC PANEL - Abnormal; Notable for the following components:      Result Value   Potassium 3.2 (*)    Glucose, Bld 107 (*)    AST 13 (*)    All other components within normal limits  CBC - Abnormal; Notable for the following components:   RBC 3.80 (*)    Hemoglobin 7.3 (*)    HCT 26.0 (*)    MCV 68.4 (*)    MCH 19.2 (*)    MCHC 28.1 (*)    RDW 17.9 (*)    Platelets 626 (*)    All other components within normal limits  URINALYSIS, ROUTINE W REFLEX MICROSCOPIC - Abnormal; Notable for the following components:    APPearance HAZY (*)    Ketones, ur 20 (*)    Nitrite POSITIVE (*)    Leukocytes,Ua LARGE (*)    Bacteria, UA MANY (*)    All other components within normal limits  URINE CULTURE  LIPASE, BLOOD  HCG, SERUM, QUALITATIVE    EKG None  Radiology  No results found.  Procedures Procedures   Medications Ordered in ED Medications  sodium chloride 0.9 % bolus 1,000 mL (0 mLs Intravenous Stopped 04/15/23 1810)  ondansetron (ZOFRAN) injection 4 mg (4 mg Intravenous Given 04/15/23 1716)  potassium chloride SA (KLOR-CON M) CR tablet 40 mEq (40 mEq Oral Given 04/15/23 1718)  cefTRIAXone (ROCEPHIN) 2 g in sodium chloride 0.9 % 100 mL IVPB (0 g Intravenous Stopped 04/15/23 2007)    ED Course/ Medical Decision Making/ A&P  Medical Decision Making Amount and/or Complexity of Data Reviewed Labs: ordered.  Risk Prescription drug management.   47 year old female presents to ED for evaluation.  Please see HPI for further details.  On examination patient is afebrile and nontachycardic.  Her lung sounds are clear bilaterally and she is not hypoxic on room air.  Her abdomen is soft and compressible with no tenderness elicited.  Neurological examination at baseline.  CBC without leukocytosis, hemoglobin 7.3.  Patient chart reviewed and the patient is found to have low hemoglobin in the past secondary to heavy menses.  Patient reports that she did have heavy menses 1 week ago.  She denies any blood in stool or blood in vomit.  She does not take blood thinning medications.  She is only endorsing weakness.  Discussed this with Dr. Anitra Lauth, we will not provide patient a unit of blood, will prescribe her iron pills and have her follow-up with PCP.  The patient states she does not have a PCP so we have consulted with transitions of care who will try and attempt to find patient PCP.  On chart review, it appears that the patient was referred to a PCP in the past however she states that she did not  follow-up.  Patient metabolic panel shows potassium 3.2 repleted with 40 mEq oral potassium, no other electrolyte derangement, no elevated LFTs, anion gap 9.  Patient urinalysis shows nitrite positive urine so we will provide 2 g of Rocephin and discharge her with Keflex, Zofran and iron pills.  The patient will follow-up with her PCP which transitions of care will help to assist with placement.  After receiving 2 g Rocephin, patient reports that she feels fine.  Patient denies any nausea or vomiting.  Patient has had no vomiting while she has been here in the department.  Patient will be discharged home and she will follow-up with PCP which transitions of care will help to set up.  Discharged in stable condition.  All questions answered to the patient satisfaction.   Final Clinical Impression(s) / ED Diagnoses Final diagnoses:  Nausea and vomiting, unspecified vomiting type  Urinary tract infection without hematuria, site unspecified    Rx / DC Orders ED Discharge Orders          Ordered    cephALEXin (KEFLEX) 500 MG capsule  4 times daily        04/15/23 2036    ondansetron (ZOFRAN) 4 MG tablet  Every 6 hours PRN        04/15/23 2036    ferrous sulfate 325 (65 FE) MG tablet  Daily        04/15/23 2036              Al Decant, PA-C 04/15/23 2044    Gwyneth Sprout, MD 04/18/23 [phone]

## 2023-04-15 NOTE — Discharge Instructions (Signed)
Return to the ED with any new or worsening signs or symptoms Please await call from transitions of care regarding PCP placement.  They will call you in the morning to assist with getting you set up with a PCP. Please begin taking Keflex.  You can begin taking this medication tomorrow.  This is for your UTI.  You will take this 4 times daily for the next 7 days. Please begin taking Zofran every 6 hours for nausea and vomiting.  This is been sent to your pharmacy.  Please also begin taking ferrous sulfate 325 mg once a day. Please continue to push fluids such as water

## 2023-04-18 LAB — URINE CULTURE: Culture: >=100000 — AB

## 2023-04-19 ENCOUNTER — Telehealth (HOSPITAL_BASED_OUTPATIENT_CLINIC_OR_DEPARTMENT_OTHER): Payer: Self-pay | Admitting: Emergency Medicine

## 2023-04-19 NOTE — Telephone Encounter (Signed)
Post ED Visit - Positive Culture Follow-up  Culture report reviewed by antimicrobial stewardship pharmacist: [facility] [address], Pharm.D. []  Celedonio Miyamoto, Pharm.D., BCPS AQ-ID []  Garvin Fila, Pharm.D., BCPS []  Georgina Pillion, Pharm.D., BCPS []  [place], [address].D., BCPS, AAHIVP []  Estella Husk, Pharm.D., BCPS, AAHIVP []  Lysle Pearl, PharmD, BCPS []  Phillips Climes, PharmD, BCPS []  Agapito Games, PharmD, BCPS [x]  Daylene Posey, PharmD []  Mervyn Gay, PharmD, BCPS []  Vinnie Level, PharmD  [facility] []  Len Childs, PharmD []  Greer Pickerel, PharmD []  Adalberto Cole, PharmD []  Perlie Gold, Rph []  Lonell Face) Jean Rosenthal, PharmD []  Earl Many, PharmD []  Junita Push, PharmD []  Dorna Leitz, PharmD []  Terrilee Files, PharmD []  Lynann Beaver, PharmD []  Keturah Barre, PharmD []  Loralee Pacas, PharmD []  Bernadene Person, PharmD   Positive urine culture Treated with Cephalexin, organism sensitive to the same and no further patient follow-up is required at this time.  Tanna Savoy Dauna Ziska 04/19/2023, 2:41 PM

## 2023-04-23 ENCOUNTER — Ambulatory Visit: Payer: BC Managed Care – PPO | Admitting: Nurse Practitioner

## 2023-06-22 ENCOUNTER — Telehealth: Payer: Self-pay

## 2023-06-22 ENCOUNTER — Encounter: Payer: Self-pay | Admitting: Physician Assistant

## 2023-06-22 ENCOUNTER — Other Ambulatory Visit (INDEPENDENT_AMBULATORY_CARE_PROVIDER_SITE_OTHER): Payer: BC Managed Care – PPO

## 2023-06-22 ENCOUNTER — Ambulatory Visit (INDEPENDENT_AMBULATORY_CARE_PROVIDER_SITE_OTHER): Payer: BC Managed Care – PPO | Admitting: Physician Assistant

## 2023-06-22 VITALS — BP 136/80 | HR 78 | Ht 66.0 in | Wt 136.0 lb

## 2023-06-22 DIAGNOSIS — D509 Iron deficiency anemia, unspecified: Secondary | ICD-10-CM | POA: Diagnosis not present

## 2023-06-22 DIAGNOSIS — R634 Abnormal weight loss: Secondary | ICD-10-CM | POA: Diagnosis not present

## 2023-06-22 DIAGNOSIS — R112 Nausea with vomiting, unspecified: Secondary | ICD-10-CM

## 2023-06-22 LAB — BASIC METABOLIC PANEL
BUN: 10 mg/dL (ref 6–23)
CO2: 28 meq/L (ref 19–32)
Calcium: 9 mg/dL (ref 8.4–10.5)
Chloride: 105 meq/L (ref 96–112)
Creatinine, Ser: 0.58 mg/dL (ref 0.40–1.20)
GFR: 107.5 mL/min (ref >60.00)
Glucose, Bld: 119 mg/dL — ABNORMAL HIGH (ref 70–99)
Potassium: 3.8 meq/L (ref 3.5–5.1)
Sodium: 138 meq/L (ref 135–145)

## 2023-06-22 LAB — CBC WITH DIFFERENTIAL/PLATELET
Basophils Absolute: 0 10*3/uL (ref 0.0–0.1)
Basophils Relative: 0 % (ref 0.0–3.0)
Eosinophils Absolute: 0.2 10*3/uL (ref 0.0–0.7)
Eosinophils Relative: 0 % (ref 0.0–5.0)
HCT: 22.7 % — CL (ref 36.0–46.0)
Hemoglobin: 6.3 g/dL — CL (ref 12.0–15.0)
Lymphocytes Relative: 31 % (ref 12.0–46.0)
Lymphs Abs: 2.3 10*3/uL (ref 0.7–4.0)
MCHC: 27.8 g/dL — ABNORMAL LOW (ref 30.0–36.0)
MCV: 62.4 fl — ABNORMAL LOW (ref 78.0–100.0)
Monocytes Absolute: 0.6 10*3/uL (ref 0.1–1.0)
Monocytes Relative: 4 % (ref 3.0–12.0)
Neutro Abs: 4.3 10*3/uL (ref 1.4–7.7)
Neutrophils Relative %: 65 % (ref 43.0–77.0)
Platelets: 487 10*3/uL — ABNORMAL HIGH (ref 150.0–400.0)
RBC: 3.64 Mil/uL — ABNORMAL LOW (ref 3.87–5.11)
RDW: 18.4 % — ABNORMAL HIGH (ref 11.5–15.5)
WBC: 7.3 10*3/uL (ref 4.0–10.5)

## 2023-06-22 LAB — SEDIMENTATION RATE: Sed Rate: -8 mm/h — ABNORMAL LOW (ref 0–20)

## 2023-06-22 LAB — VITAMIN B12: Vitamin B-12: 688 pg/mL (ref 211–911)

## 2023-06-22 LAB — FERRITIN: Ferritin: 2.4 ng/mL — ABNORMAL LOW (ref 10.0–291.0)

## 2023-06-22 LAB — FOLATE: Folate: 16 ng/mL (ref >5.9)

## 2023-06-22 LAB — TSH: TSH: 0.71 u[IU]/mL (ref 0.35–5.50)

## 2023-06-22 MED ORDER — OMEPRAZOLE 40 MG PO CPDR: 40.0000 mg | DELAYED_RELEASE_CAPSULE | Freq: Every morning | ORAL | 11 refills | Status: DC

## 2023-06-22 MED ORDER — ONDANSETRON 4 MG PO TBDP: ORAL_TABLET | ORAL | 3 refills | Status: AC

## 2023-06-22 MED ORDER — NA SULFATE-K SULFATE-MG SULF 17.5-3.13-1.6 GM/177ML PO SOLN: 1.0000 | ORAL | 0 refills | Status: DC

## 2023-06-22 MED ORDER — FERROUS SULFATE 325 (65 FE) MG PO TABS: 325.0000 mg | ORAL_TABLET | Freq: Every day | ORAL | 6 refills | Status: DC

## 2023-06-22 NOTE — Patient Instructions (Addendum)
_______________________________________________________  If your blood pressure at your visit was 140/90 or greater, please contact your primary care physician to follow up on this.  If you are age 48 or younger, your body mass index should be between 19-25. Your Body mass index is 21.95 kg/m. If this is out of the aformentioned range listed, please consider follow up with your Primary Care Provider.  ________________________________________________________  The [place] [facility] providers would like to encourage you to use [facility] to communicate with providers for non-urgent requests or questions.  Due to long hold times on the telephone, sending your provider a message by [facility] may be a faster and more efficient way to get a response.  Please allow 48 business hours for a response.  Please remember that this is for non-urgent requests.  _______________________________________________________  Your provider has requested that you go to the basement level for lab work before leaving today. Press "B" on the elevator. The lab is located at the first door on the left as you exit the elevator.  We have sent the following medications to your pharmacy for you to pick up at your convenience:  START: Zofran 4mg  ODT every 6 to 8 hours as needed for nausea and vomiting  START: Iron 325 mg one tablet twice daily with food.  START: omeprazole 40mg  one capsule daily every morning daily  Drink 2 to 3 Boost daily  STOP all Advil, Aleve, BC powder.  OK to use Tylenol.  Please an appointment with your OB-GYN.  You have been scheduled for an endoscopy and colonoscopy. Please follow the written instructions given to you at your visit today.  Please pick up your prep supplies at the pharmacy within the next 1-3 days.  If you use inhalers (even only as needed), please bring them with you on the day of your procedure.  DO NOT TAKE 7 DAYS PRIOR TO TEST- Trulicity (dulaglutide) Ozempic, Wegovy  (semaglutide) Mounjaro (tirzepatide) Bydureon Bcise (exanatide extended release)  DO NOT TAKE 1 DAY PRIOR TO YOUR TEST Rybelsus (semaglutide) Adlyxin (lixisenatide) Victoza (liraglutide) Byetta (exanatide) ___________________________________________________________________________  Due to recent changes in healthcare laws, you may see the results of your imaging and laboratory studies on MyChart before your provider has had a chance to review them.  We understand that in some cases there may be results that are confusing or concerning to you. Not all laboratory results come back in the same time frame and the provider may be waiting for multiple results in order to interpret others.  Please give [place] 48 hours in order for your provider to thoroughly review all the results before contacting the office for clarification of your results.

## 2023-06-22 NOTE — Telephone Encounter (Signed)
Faith Castillo from our lab called with a critical hemoglobin of 6.3 and a critical hematocrit of 22.7.

## 2023-06-22 NOTE — Progress Notes (Signed)
Subjective:    Patient ID: Faith Castillo, female    DOB: Dec 26, 1975, 47 y.o.   MRN: [id number]  HPI Faith Castillo is a pleasant 47 year old African-American female, new to GI today, referred after most recent ER visit on 04/15/2023.  Patient does not have a PCP. When she was seen in the emergency room on 04/15/2023 she had complaints of nausea and vomiting as well as abdominal pain over the past 2 to 3 years.  Per those notes she stated she came to the ER because she was" tired of feeling sick ". She did not have any abdominal imaging done at that time Labs revealed a hemoglobin of 7.3/hematocrit 26 and MCV of 68 Potassium was 3.2, LFTs within normal limits UA was positive and she was treated with a dose of Rocephin and a course of Keflex. She was asked to follow-up with GI and GYN.  She gives history of long-term very heavy menses. She actually had an admission in 2022 with hemoglobin of 5.8, was documented Hemoccult negative at that time, iron deficient, was transfused, given an iron infusion, and discharged. 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  She had been taking a lot of Goody powders and BC powders, says she was told sometime recently that this might be the root of her problem she is trying to switch to Tylenol but has been using Tylenol and Advil fairly regularly over the past couple of months since coming off of BCs and Goody's.  She has had ongoing slow weight loss over the past 3 to 4 years, says she used to weigh over 200 pounds and is now 136 today.  She has had intermittent problems with nausea and vomiting.  She says currently not having any abdominal pain, has been somewhat nauseated off-and-on but has not been vomiting over the past month.  She still has no appetite.  She says she tries to eat small amounts, has some aversion to some food smells.  She has been trying to drink at least 1 boost per day if  she is not hungry. He is not aware of any changes in her bowel habits, nor any melena or hematochezia. She was told after the hospitalization in 2022 to get GYN care but has not been seen by GYN. No prior GI evaluation. She says she does have iron tablets at home but has not been taking them.  She is not on any current PPI despite having Protonix listed on her med list and Carafate.  She does not believe she has had any of these medicines recently.  She does use Zofran if needed for nausea.  Did have CT imaging with contrast in August 2023 which was negative other than a simple cyst in the left kidney.  She says she usually feels tired but denies any problems with shortness of breath chest pain etc.  Review of Systems.Pertinent positive and negative review of systems were noted in the above HPI section.  All other review of systems was otherwise negative.   Outpatient Encounter Medications as of 06/22/2023  Medication Sig   cephALEXin (KEFLEX) 500 MG capsule Take 1 capsule (500 mg total) by mouth 4 (four) times daily.   Cyanocobalamin (B-12 PO) Take 1 tablet by mouth daily.   iron polysaccharides (NIFEREX) 150 MG capsule Take 1 capsule (150 mg total) by mouth daily.   Multiple Vitamins-Minerals (  MULTIVITAMIN ADULT) CHEW Chew 1 tablet by mouth daily.   Na Sulfate-K Sulfate-Mg Sulf (SUPREP BOWEL PREP KIT) 17.5-3.13-1.6 GM/177ML SOLN Take 1 kit by mouth as directed.   omeprazole (PRILOSEC) 40 MG capsule Take 1 capsule (40 mg total) by mouth every morning.   [DISCONTINUED] ferrous sulfate 325 (65 FE) MG tablet Take 1 tablet (325 mg total) by mouth daily.   [DISCONTINUED] ondansetron (ZOFRAN) 4 MG tablet Take 1 tablet (4 mg total) by mouth every 6 (six) hours as needed for nausea or vomiting.   ferrous sulfate 325 (65 FE) MG tablet Take 1 tablet (325 mg total) by mouth daily. Take with food   methocarbamol (ROBAXIN) 500 MG tablet Take 1 tablet (500 mg total) by mouth at bedtime as needed for muscle  spasms. (Patient not taking: Reported on 06/22/2023)   ondansetron (ZOFRAN-ODT) 4 MG disintegrating tablet Use 1 tablet every 6 to 8 hours as needed for nausea and vomiting   potassium chloride SA (KLOR-CON M) 20 MEQ tablet Take 1 tablet (20 mEq total) by mouth daily for 3 days.   sucralfate (CARAFATE) 1 GM/10ML suspension Take 10 mLs (1 g total) by mouth 4 (four) times daily -  with meals and at bedtime. (Patient not taking: Reported on 06/22/2023)   [DISCONTINUED] ondansetron (ZOFRAN-ODT) 4 MG disintegrating tablet Take 1 tablet (4 mg total) by mouth every 8 (eight) hours as needed for nausea or vomiting. (Patient not taking: Reported on 06/22/2023)   [DISCONTINUED] pantoprazole (PROTONIX) 40 MG tablet Take 1 tablet (40 mg total) by mouth daily. (Patient not taking: Reported on 06/22/2023)   No facility-administered encounter medications on file as of 06/22/2023.   No Known Allergies Patient Active Problem List   Diagnosis Date Noted   Symptomatic anemia 01/05/2021   Nausea & vomiting 01/05/2021   Weight loss 01/05/2021   Essential hypertension 01/05/2021   Social History   Socioeconomic History   Marital status: Single    Spouse name: Not on file   Number of children: 5   Years of education: Not on file   Highest education level: Not on file  Occupational History   Occupation: CNA  Tobacco Use   Smoking status: Never   Smokeless tobacco: Never  Vaping Use   Vaping status: Never Used  Substance and Sexual Activity   Alcohol use: No   Drug use: No   Sexual activity: Yes  Other Topics Concern   Not on file  Social History Narrative   Not on file   Social Determinants of Health   Financial Resource Strain: Not on file  Food Insecurity: Not on file  Transportation Needs: Not on file  Physical Activity: Not on file  Stress: Not on file  Social Connections: Not on file  Intimate Partner Violence: Not on file    Ms. Lias's family history includes Hypertension in her father and  mother.      Objective:    Vitals:   06/22/23 1124  BP: 136/80  Pulse: 78    Physical Exam Well-developed well-nourished African-American female in no acute distress.  Height, Weight, 136 BMI 21.9  HEENT; nontraumatic normocephalic, EOMI, PE R LA, sclera anicteric. Oropharynx; not examined today Neck; supple, no JVD Cardiovascular; regular rate and rhythm with S1-S2, no murmur rub or gallop Pulmonary; Clear bilaterally Abdomen; soft, nontender, nondistended, slight nodularity around the umbilicus ,no palpable mass or hepatosplenomegaly, bowel sounds are active Rectal; not done today Skin; benign exam, no jaundice rash or appreciable lesions Extremities; no clubbing  cyanosis or edema skin warm and dry Neuro/Psych; alert and oriented x4, grossly nonfocal mood and affect appropriate /flat       Assessment & Plan:   #18 47 year old African-American female with long history of iron deficiency anemia, hospitalized in 2022 with hemoglobin of 5.8, required transfusions and iron infusion, was Hemoccult negative at that time and has history of chronic heavy menses and menorrhagia was felt etiology of the iron deficiency anemia. She has not sought GYN care as she was advised at that time.  Recent ER visit 04/15/2023 secondary to persistent symptoms of nausea and vomiting over the past 2 to 3 years intermittent upper abdominal pain and weight loss.  Found to have hemoglobin of 7.3/microcytic Advised to take oral iron which she has not been doing.  Etiology of the ongoing nausea and vomiting and weight loss is not entirely clear, she had been a heavy user of BC powders and Goody powders up to a couple of months ago, she may have peptic ulcer disease, chronic gastropathy, consider pyloric stenosis etc. from chronic ulcer disease, or nonhealing ulcer.  She has not had any evidence of GI bleeding, no melena or hematochezia.  CT of the abdomen and pelvis August 2023 unrevealing done for same  symptoms.  Patient has not been too concerned about her symptoms or lab findings and has not been compliant with meds and/or obtaining suggested medical care query underlying depression  #2 weight loss of 80 pounds over the past 3 to 4 years secondary to above #3 history of hypertension   Plan; patient encouraged to eat small frequent meals/snacks and increase boost to 2-3 times daily if she is not eating much CBC with differential today, be met, anemia panel, TSH  Start ferrous sulfate 325 mg p.o. twice daily taken with food Start omeprazole 40 mg p.o. every morning AC breakfast every day Refill Zofran ODT every 6-8 hours as needed for nausea Advised her to stay completely off of BCs and Goody's moving forward and also needs to discontinue any use of Advil or Aleve and use only regular strength Tylenol.  She may require iron infusion  Will schedule for EGD and colonoscopy with Dr. Leonides Schanz.  Both procedures were discussed in detail with the patient including indications risks and benefits and she is agreeable to proceed.  I also asked her to make herself an appointment to see gynecology as still feel menorrhagia is primary cards for the chronic iron deficiency anemia. The recommendations pending results of labs and endoscopic evaluation.  She will be established with Dr. Kristeen Miss PA-C 06/22/2023   Cc: No ref. provider found

## 2023-06-23 LAB — IRON AND TIBC
Iron Saturation: 3 % — CL (ref 15–55)
Iron: 10 ug/dL — ABNORMAL LOW (ref 27–159)
Total Iron Binding Capacity: 397 ug/dL (ref 250–450)
UIBC: 387 ug/dL (ref 131–425)

## 2023-06-25 ENCOUNTER — Inpatient Hospital Stay (HOSPITAL_COMMUNITY)
Admission: EM | Admit: 2023-06-25 | Discharge: 2023-06-28 | DRG: 378 | Disposition: A | Discharge status: Home / Self Care | Payer: BC Managed Care – PPO | Attending: Internal Medicine | Admitting: Internal Medicine

## 2023-06-25 ENCOUNTER — Observation Stay (HOSPITAL_COMMUNITY): Payer: BC Managed Care – PPO

## 2023-06-25 ENCOUNTER — Encounter (HOSPITAL_COMMUNITY): Payer: Self-pay

## 2023-06-25 ENCOUNTER — Other Ambulatory Visit: Payer: Self-pay

## 2023-06-25 DIAGNOSIS — N939 Abnormal uterine and vaginal bleeding, unspecified: Secondary | ICD-10-CM | POA: Diagnosis not present

## 2023-06-25 DIAGNOSIS — D500 Iron deficiency anemia secondary to blood loss (chronic): Secondary | ICD-10-CM

## 2023-06-25 DIAGNOSIS — Z79899 Other long term (current) drug therapy: Secondary | ICD-10-CM | POA: Diagnosis not present

## 2023-06-25 DIAGNOSIS — K2970 Gastritis, unspecified, without bleeding: Secondary | ICD-10-CM

## 2023-06-25 DIAGNOSIS — K922 Gastrointestinal hemorrhage, unspecified: Secondary | ICD-10-CM

## 2023-06-25 DIAGNOSIS — T454X6A Underdosing of iron and its compounds, initial encounter: Secondary | ICD-10-CM | POA: Diagnosis not present

## 2023-06-25 DIAGNOSIS — Z91128 Patient's intentional underdosing of medication regimen for other reason: Secondary | ICD-10-CM | POA: Diagnosis not present

## 2023-06-25 DIAGNOSIS — K264 Chronic or unspecified duodenal ulcer with hemorrhage: Principal | ICD-10-CM

## 2023-06-25 DIAGNOSIS — K219 Gastro-esophageal reflux disease without esophagitis: Secondary | ICD-10-CM | POA: Diagnosis present

## 2023-06-25 DIAGNOSIS — K2961 Other gastritis with bleeding: Secondary | ICD-10-CM | POA: Diagnosis not present

## 2023-06-25 DIAGNOSIS — B3781 Candidal esophagitis: Secondary | ICD-10-CM

## 2023-06-25 DIAGNOSIS — N920 Excessive and frequent menstruation with regular cycle: Secondary | ICD-10-CM | POA: Diagnosis not present

## 2023-06-25 DIAGNOSIS — K2950 Unspecified chronic gastritis without bleeding: Secondary | ICD-10-CM | POA: Diagnosis not present

## 2023-06-25 DIAGNOSIS — B9681 Helicobacter pylori [H. pylori] as the cause of diseases classified elsewhere: Secondary | ICD-10-CM

## 2023-06-25 DIAGNOSIS — D649 Anemia, unspecified: Secondary | ICD-10-CM | POA: Diagnosis not present

## 2023-06-25 DIAGNOSIS — D62 Acute posthemorrhagic anemia: Secondary | ICD-10-CM

## 2023-06-25 DIAGNOSIS — K921 Melena: Secondary | ICD-10-CM

## 2023-06-25 DIAGNOSIS — T39395A Adverse effect of other nonsteroidal anti-inflammatory drugs [NSAID], initial encounter: Secondary | ICD-10-CM | POA: Diagnosis not present

## 2023-06-25 DIAGNOSIS — Z8249 Family history of ischemic heart disease and other diseases of the circulatory system: Secondary | ICD-10-CM | POA: Diagnosis not present

## 2023-06-25 DIAGNOSIS — K269 Duodenal ulcer, unspecified as acute or chronic, without hemorrhage or perforation: Secondary | ICD-10-CM | POA: Diagnosis not present

## 2023-06-25 DIAGNOSIS — I10 Essential (primary) hypertension: Secondary | ICD-10-CM | POA: Diagnosis present

## 2023-06-25 DIAGNOSIS — D509 Iron deficiency anemia, unspecified: Secondary | ICD-10-CM | POA: Diagnosis not present

## 2023-06-25 DIAGNOSIS — R634 Abnormal weight loss: Secondary | ICD-10-CM | POA: Diagnosis present

## 2023-06-25 DIAGNOSIS — K254 Chronic or unspecified gastric ulcer with hemorrhage: Secondary | ICD-10-CM | POA: Diagnosis not present

## 2023-06-25 HISTORY — DX: Encounter for other specified aftercare: Z51.89

## 2023-06-25 LAB — CBC WITH DIFFERENTIAL/PLATELET
Abs Immature Granulocytes: 0.01 10*3/uL (ref 0.00–0.07)
Basophils Absolute: 0 10*3/uL (ref 0.0–0.1)
Basophils Relative: 0 %
Eosinophils Absolute: 0.2 10*3/uL (ref 0.0–0.5)
Eosinophils Relative: 2 %
HCT: 22.6 % — ABNORMAL LOW (ref 36.0–46.0)
Hemoglobin: 5.8 g/dL — CL (ref 12.0–15.0)
Immature Granulocytes: 0 %
Lymphocytes Relative: 31 %
Lymphs Abs: 2.1 10*3/uL (ref 0.7–4.0)
MCH: 17.6 pg — ABNORMAL LOW (ref 26.0–34.0)
MCHC: 25.7 g/dL — ABNORMAL LOW (ref 30.0–36.0)
MCV: 68.5 fL — ABNORMAL LOW (ref 80.0–100.0)
Monocytes Absolute: 0.6 10*3/uL (ref 0.1–1.0)
Monocytes Relative: 9 %
Neutro Abs: 3.9 10*3/uL (ref 1.7–7.7)
Neutrophils Relative %: 58 %
Platelets: 494 10*3/uL — ABNORMAL HIGH (ref 150–400)
RBC: 3.3 MIL/uL — ABNORMAL LOW (ref 3.87–5.11)
RDW: 18.3 % — ABNORMAL HIGH (ref 11.5–15.5)
WBC: 6.7 10*3/uL (ref 4.0–10.5)
nRBC: 0 % (ref 0.0–0.2)

## 2023-06-25 LAB — IRON AND TIBC
Iron: 14 ug/dL — ABNORMAL LOW (ref 28–170)
Saturation Ratios: 3 % — ABNORMAL LOW (ref 10.4–31.8)
TIBC: 442 ug/dL (ref 250–450)
UIBC: 428 ug/dL

## 2023-06-25 LAB — VITAMIN B12: Vitamin B-12: 682 pg/mL (ref 180–914)

## 2023-06-25 LAB — BASIC METABOLIC PANEL
Anion gap: 5 (ref 5–15)
BUN: 9 mg/dL (ref 6–20)
CO2: 28 mmol/L (ref 22–32)
Calcium: 8.5 mg/dL — ABNORMAL LOW (ref 8.9–10.3)
Chloride: 103 mmol/L (ref 98–111)
Creatinine, Ser: 0.61 mg/dL (ref 0.44–1.00)
GFR, Estimated: >60 mL/min (ref >60)
Glucose, Bld: 107 mg/dL — ABNORMAL HIGH (ref 70–99)
Potassium: 3.5 mmol/L (ref 3.5–5.1)
Sodium: 136 mmol/L (ref 135–145)

## 2023-06-25 LAB — HIV ANTIBODY (ROUTINE TESTING W REFLEX): HIV Screen 4th Generation wRfx: NONREACTIVE

## 2023-06-25 LAB — FERRITIN: Ferritin: 3 ng/mL — ABNORMAL LOW (ref 11–307)

## 2023-06-25 LAB — RETICULOCYTES
Immature Retic Fract: 21.2 % — ABNORMAL HIGH (ref 2.3–15.9)
RBC.: 3.46 MIL/uL — ABNORMAL LOW (ref 3.87–5.11)
Retic Count, Absolute: 44.3 10*3/uL (ref 19.0–186.0)
Retic Ct Pct: 1.3 % (ref 0.4–3.1)

## 2023-06-25 LAB — FOLATE: Folate: 19.2 ng/mL (ref >5.9)

## 2023-06-25 LAB — PREPARE RBC (CROSSMATCH)

## 2023-06-25 MED ORDER — HYDRALAZINE HCL 20 MG/ML IJ SOLN: 5.0000 mg | Freq: Four times a day (QID) | INTRAMUSCULAR | Status: DC | PRN

## 2023-06-25 MED ORDER — ACETAMINOPHEN 325 MG PO TABS: 650.0000 mg | ORAL_TABLET | Freq: Four times a day (QID) | ORAL | Status: DC | PRN

## 2023-06-25 MED ORDER — PEG-KCL-NACL-NASULF-NA ASC-C 100 G PO SOLR
0.5000 | Freq: Once | ORAL | Status: AC
  Administered 2023-06-26: 100 g via ORAL
  Filled 2023-06-25: qty 1

## 2023-06-25 MED ORDER — PEG-KCL-NACL-NASULF-NA ASC-C 100 G PO SOLR: 1.0000 | Freq: Once | ORAL | Status: DC

## 2023-06-25 MED ORDER — FERROUS SULFATE 325 (65 FE) MG PO TABS
325.0000 mg | ORAL_TABLET | Freq: Every day | ORAL | Status: DC
  Administered 2023-06-25 – 2023-06-26 (×2): 325 mg via ORAL
  Filled 2023-06-25 (×2): qty 1

## 2023-06-25 MED ORDER — BISACODYL 5 MG PO TBEC
10.0000 mg | DELAYED_RELEASE_TABLET | Freq: Once | ORAL | Status: AC
  Administered 2023-06-25: 10 mg via ORAL
  Filled 2023-06-25: qty 2

## 2023-06-25 MED ORDER — VITAMIN B-12 100 MCG PO TABS
100.0000 ug | ORAL_TABLET | Freq: Every day | ORAL | Status: DC
  Administered 2023-06-27 – 2023-06-28 (×2): 100 ug via ORAL
  Filled 2023-06-25 (×3): qty 1

## 2023-06-25 MED ORDER — ONDANSETRON HCL 4 MG PO TABS: 4.0000 mg | ORAL_TABLET | Freq: Four times a day (QID) | ORAL | Status: DC | PRN

## 2023-06-25 MED ORDER — SODIUM CHLORIDE 0.9% IV SOLUTION: Freq: Once | INTRAVENOUS | Status: DC

## 2023-06-25 MED ORDER — ACETAMINOPHEN 650 MG RE SUPP: 650.0000 mg | Freq: Four times a day (QID) | RECTAL | Status: DC | PRN

## 2023-06-25 MED ORDER — AMLODIPINE BESYLATE 5 MG PO TABS
5.0000 mg | ORAL_TABLET | Freq: Every day | ORAL | Status: DC
  Administered 2023-06-25 – 2023-06-26 (×2): 5 mg via ORAL
  Filled 2023-06-25 (×2): qty 1

## 2023-06-25 MED ORDER — PEG-KCL-NACL-NASULF-NA ASC-C 100 G PO SOLR
0.5000 | Freq: Once | ORAL | Status: AC
  Administered 2023-06-25: 100 g via ORAL
  Filled 2023-06-25 (×2): qty 1

## 2023-06-25 MED ORDER — PANTOPRAZOLE SODIUM 40 MG IV SOLR
40.0000 mg | Freq: Two times a day (BID) | INTRAVENOUS | Status: DC
  Administered 2023-06-25 – 2023-06-28 (×7): 40 mg via INTRAVENOUS
  Filled 2023-06-25 (×7): qty 10

## 2023-06-25 MED ORDER — ONDANSETRON HCL 4 MG/2ML IJ SOLN
4.0000 mg | Freq: Four times a day (QID) | INTRAMUSCULAR | Status: DC | PRN
  Administered 2023-06-26: 4 mg via INTRAVENOUS
  Filled 2023-06-25: qty 2

## 2023-06-25 NOTE — H&P (Signed)
History and Physical    DOA: 06/25/2023  PCP: Pcp, No  Patient coming from: home  Chief Complaint: fatigue and referred for low Hgb  HPI: Faith Castillo is a 47 y.o. female with history h/o HTN, chronic Fe def anemia in the setting of menorrhagia-requiring hospitalization in 2022 for blood transfusion/iron infusion as well as recent ED visit on June 30 for GI symptoms, weight loss and anemia, found to have a hemoglobin 7.3 but reported noncompliance with iron supplementation.  She was seen by GI as outpatient last week (06/22/2023) for complaints of ongoing nausea, vomiting and 80 pounds weight loss over 3 to 4 years-she did report using Goody powder up to a couple of months prior, on lab work patient noted to have a hemoglobin 6.3-GI recommended to stay off Goody powders, prescribed oral iron sulfate, omeprazole, Zofran as needed and plan was for outpatient EGD/colonoscopy.  Today patient presents with complaints of weakness and noted to have a hemoglobin 6.3 on labs drawn at GI office on Friday visit and referred to the ED today where repeat hemoglobin was 5.8, MCV 68.5.  Patient denies any dyspnea or chest pain or dizziness or headache.  She does have a history of menorrhagia with clots at times and her last menstrual period started on 9/2-ongoing.  Patient denies any ongoing Goody powder or NSAID use.  She however is not clear about iron supplementation compliance. She is requested to be admitted for blood transfusion (initiated by ED physician) and GI evaluation.  Patient denies seeing GYN for menorrhagia although she was advised in the past.  She states she just did not get around making an appointment.  Her last pelvic ultrasound was during childbirth in 2010    Review of Systems: As per HPI, otherwise review of systems negative.    Past Medical History:  Diagnosis Date   Blood transfusion without reported diagnosis    Hypertension   -Menorrhagia  History reviewed. No pertinent surgical  history.  Social history:  reports that she has never smoked. She has never used smokeless tobacco. She reports that she does not drink alcohol and does not use drugs.   No Known Allergies  Family History  Problem Relation Age of Onset   Hypertension Mother    Hypertension Father    Colon cancer Neg Hx    Stomach cancer Neg Hx    Esophageal cancer Neg Hx       Prior to Admission medications   Medication Sig Start Date End Date Taking? Authorizing Provider  cephALEXin (KEFLEX) 500 MG capsule Take 1 capsule (500 mg total) by mouth 4 (four) times daily. 04/15/23   Al Decant, PA-C  Cyanocobalamin (B-12 PO) Take 1 tablet by mouth daily.    [provider]  ferrous sulfate 325 (65 FE) MG tablet Take 1 tablet (325 mg total) by mouth daily. Take with food 06/22/23   Esterwood, Amy S, PA-C  iron polysaccharides (NIFEREX) 150 MG capsule Take 1 capsule (150 mg total) by mouth daily. 01/07/21   Joseph Art, DO  methocarbamol (ROBAXIN) 500 MG tablet Take 1 tablet (500 mg total) by mouth at bedtime as needed for muscle spasms. Patient not taking: Reported on 06/22/2023 12/16/21   Merrilee Jansky, MD  Multiple Vitamins-Minerals (MULTIVITAMIN ADULT) CHEW Chew 1 tablet by mouth daily.    [provider]  Na Sulfate-K Sulfate-Mg Sulf (SUPREP BOWEL PREP KIT) 17.5-3.13-1.6 GM/177ML SOLN Take 1 kit by mouth as directed. 06/22/23   Esterwood, Amy  S, PA-C  omeprazole (PRILOSEC) 40 MG capsule Take 1 capsule (40 mg total) by mouth every morning. 06/22/23   Esterwood, Amy S, PA-C  ondansetron (ZOFRAN-ODT) 4 MG disintegrating tablet Use 1 tablet every 6 to 8 hours as needed for nausea and vomiting 06/22/23   Esterwood, Amy S, PA-C  potassium chloride SA (KLOR-CON M) 20 MEQ tablet Take 1 tablet (20 mEq total) by mouth daily for 3 days. 06/05/22 06/08/22  Petrucelli, Samantha R, PA-C  sucralfate (CARAFATE) 1 GM/10ML suspension Take 10 mLs (1 g total) by mouth 4 (four) times daily -  with meals  and at bedtime. Patient not taking: Reported on 06/22/2023 06/05/22   Cherly Anderson, PA-C    Physical Exam: Vitals:   06/25/23 1153 06/25/23 1216 06/25/23 1442  BP: (!) 144/90  (!) 151/78  Pulse: 90  76  Resp: 16  13  Temp: 99.2 F (37.3 C)  98.3 F (36.8 C)  TempSrc: Oral  Oral  SpO2: 100%  100%  Weight:  61.7 kg   Height:  5\' 6"  (1.676 m)     Constitutional: NAD, calm, comfortable Eyes: PERRL, lids and conjunctivae normal ENMT: Mucous membranes are moist. Posterior pharynx clear of any exudate or lesions.Normal dentition.  Neck: normal, supple, no masses, no thyromegaly Respiratory: clear to auscultation bilaterally, no wheezing, no crackles. Normal respiratory effort. No accessory muscle use.  Cardiovascular: Regular rate and rhythm, no murmurs / rubs / gallops. No extremity edema. 2+ pedal pulses. No carotid bruits.  Abdomen: no tenderness, no masses palpated. No hepatosplenomegaly. Bowel sounds positive.  Musculoskeletal: no clubbing / cyanosis. No joint deformity upper and lower extremities. Good ROM, no contractures. Normal muscle tone.  Neurologic: CN 2-12 grossly intact. Sensation intact, DTR normal. Strength 5/5 in all 4.  Psychiatric: Normal judgment and insight. Alert and oriented x 3. Normal mood.  SKIN/catheters: no rashes, lesions, ulcers. No induration  Labs on Admission: I have personally reviewed following labs and imaging studies  CBC: Recent Labs  Lab 06/22/23 1228 06/25/23 1259  WBC 7.3 6.7  NEUTROABS 4.3 R 3.9  HGB 6.3 Repeated and verified X2.* 5.8*  HCT 22.7 Repeated and verified X2.* 22.6*  MCV 62.4* 68.5*  PLT 487.0* 494*   Basic Metabolic Panel: Recent Labs  Lab 06/22/23 1228 06/25/23 1259  NA 138 136  K 3.8 3.5  CL 105 103  CO2 28 28  GLUCOSE 119* 107*  BUN 10 9  CREATININE 0.58 0.61  CALCIUM 9.0 8.5*   GFR: Estimated Creatinine Clearance: 81.4 mL/min (by C-G formula based on SCr of 0.61 mg/dL). Recent Labs  Lab  06/22/23 1228 06/25/23 1259  WBC 7.3 6.7   Liver Function Tests: No results for input(s): "AST", "ALT", "ALKPHOS", "BILITOT", "PROT", "ALBUMIN" in the last 168 hours. No results for input(s): "LIPASE", "AMYLASE" in the last 168 hours. No results for input(s): "AMMONIA" in the last 168 hours. Coagulation Profile: No results for input(s): "INR", "PROTIME" in the last 168 hours. Cardiac Enzymes: No results for input(s): "CKTOTAL", "CKMB", "CKMBINDEX", "TROPONINI" in the last 168 hours. BNP (last 3 results) No results for input(s): "PROBNP" in the last 8760 hours. HbA1C: No results for input(s): "HGBA1C" in the last 72 hours. CBG: No results for input(s): "GLUCAP" in the last 168 hours. Lipid Profile: No results for input(s): "CHOL", "HDL", "LDLCALC", "TRIG", "CHOLHDL", "LDLDIRECT" in the last 72 hours. Thyroid Function Tests: No results for input(s): "TSH", "T4TOTAL", "FREET4", "T3FREE", "THYROIDAB" in the last 72 hours. Anemia Panel: No results  for input(s): "VITAMINB12", "FOLATE", "FERRITIN", "TIBC", "IRON", "RETICCTPCT" in the last 72 hours. Urine analysis:    Component Value Date/Time   COLORURINE YELLOW 04/15/2023 1815   APPEARANCEUR HAZY (A) 04/15/2023 1815   LABSPEC 1.012 04/15/2023 1815   PHURINE 7.0 04/15/2023 1815   GLUCOSEU NEGATIVE 04/15/2023 1815   HGBUR NEGATIVE 04/15/2023 1815   BILIRUBINUR NEGATIVE 04/15/2023 1815   KETONESUR 20 (A) 04/15/2023 1815   PROTEINUR NEGATIVE 04/15/2023 1815   UROBILINOGEN 4.0 (H) 01/04/2021 1649   NITRITE POSITIVE (A) 04/15/2023 1815   LEUKOCYTESUR LARGE (A) 04/15/2023 1815    Radiological Exams on Admission: Personally reviewed  No results found.  EKG: Independently reviewed.  Normal sinus rhythm, no acute ST-T changes     Assessment and Plan:   Principal Problem:   Symptomatic anemia Active Problems:   Weight loss   Essential hypertension   Menorrhagia   Iron (Fe) deficiency anemia    1.Acute on chronic, now  symptomatic iron deficiency anemia: Hemoglobin critically low at 5.8 on admission.  No complaints of chest pain, shortness of breath.  EKG unremarkable.  Does complain of weakness.  Will admit, transfuse 2 unit PRBC and repeat posttransfusion hemoglobin to assess further needs.  May need iron infusion as well.  Iron studies sent from ED.  Patient educated about the importance of medication compliance/iron supplementation to maintain hemoglobin greater than 7 and reduce hospitalizations.  Appreciate GI eval-plan for EGD/colonoscopy in this hospitalization.  She also needs to follow-up with GYN at some point for definitive menorrhagia treatment.  2. Chronic nausea, dyspepsia and weight loss: Patient Hemoccult negative previously.  Will repeat stool for occult blood.  Seen by GI recently and recommended EGD colonoscopy-now to evaluate as inpatient.  Will order IV PPI while here given suspicion for PUD/gastritis.  She reports abstinence from chronic NSAIDs/Goody powder use.  3.  Hypertension: Pressure currently elevated at systolic 140-150s.  Home med list does not show any antihypertensives.  Will add low-dose Norvasc.  4.  Menorrhagia: Patient reports typical menstrual cycle lasting 7 days with clots at times.  She states she has always had heavy periods but started passing clots more recently after she turned 39.  Unclear etiology-?  Fibroids.  Last transvaginal ultrasound in the system only from 2009/2010.  She reports undergoing fallopian tube embolization procedure (Essure) for birth control and not on oral contraceptives anymore.  Has not been compliant with GYN follow-up as recommended previously.  Will obtain pelvic ultrasound to start with and encouraged patient to find a gynecologist close to where she lives for further workup and management.  She understands menorrhagia treatment will help reduce hospitalizations for anemia/blood transfusions.  DVT prophylaxis: SCDs given concern for problem #1 and  problem #4   Code Status: Full code   Patient/Family Communication: Discussed with patient and all questions answered to satisfaction.  Consults called: GI Admission status :Patient will be admitted under OBSERVATION status.The patient's presenting symptoms, physical exam findings, and initial radiographic and laboratory data in the context of their medical condition is felt to place them at low risk for further clinical deterioration. Furthermore, it is anticipated that the patient will be medically stable for discharge from the hospital within 2 midnights of hospital stay.       Alessandra Bevels MD [facility] Pager in [place]  If 7PM-7AM, please contact night-coverage [email or web address]   06/25/2023, 4:07 PM

## 2023-06-25 NOTE — ED Notes (Signed)
ED TO INPATIENT HANDOFF REPORT  ED Nurse Name and Phone #:     Rodney Booze [phone]  S Name/Age/Gender Faith Castillo 47 y.o. female Room/Bed: 024C/024C  Code Status   Code Status: Full Code  Home/SNF/Other Home Patient oriented to: self, place, time, and situation Is this baseline? Yes   Triage Complete: Triage complete  Chief Complaint Symptomatic anemia [D64.9]  Triage Note Pt states she was called on Friday by her Gi doctor stating her hgb 6.3 and her iron was non existent. Pt c.o fatigue.    Allergies No Known Allergies  Level of Care/Admitting Diagnosis ED Disposition     ED Disposition  Admit   Condition  --   Comment  Hospital Area: [facility] [[id number]]  Level of Care: Telemetry Medical [104]  May place patient in observation at [facility] or [facility] if equivalent level of care is available:: Yes  Covid Evaluation: Asymptomatic - no recent exposure (last 10 days) testing not required  Diagnosis: Symptomatic anemia [0981191]  Admitting Physician: Alessandra Bevels [[id number]]  Attending Physician: Alessandra Bevels [[id number]]          B Medical/Surgery History Past Medical History:  Diagnosis Date   Blood transfusion without reported diagnosis    Hypertension    History reviewed. No pertinent surgical history.   A IV Location/Drains/Wounds Patient Lines/Drains/Airways Status     Active Line/Drains/Airways     None            Intake/Output Last 24 hours No intake or output data in the 24 hours ending 06/25/23 1631  Labs/Imaging Results for orders placed or performed during the hospital encounter of 06/25/23 (from the past 48 hour(s))  Type and screen [facility]     Status: None (Preliminary result)   Collection Time: 06/25/23 12:57 PM  Result Value Ref Range   ABO/RH(D) A POS    Antibody Screen NEG    Sample Expiration      06/28/2023,2359 Performed at [facility], [address],  [place], [place] [address]    Unit Number [id number]    Blood Component Type RBC LR PHER2    Unit division 00    Status of Unit ALLOCATED    Transfusion Status OK TO TRANSFUSE    Crossmatch Result Compatible    Unit Number [id number]    Blood Component Type RED CELLS,LR    Unit division 00    Status of Unit ALLOCATED    Transfusion Status OK TO TRANSFUSE    Crossmatch Result Compatible   Basic metabolic panel     Status: Abnormal   Collection Time: 06/25/23 12:59 PM  Result Value Ref Range   Sodium 136 135 - 145 mmol/L   Potassium 3.5 3.5 - 5.1 mmol/L   Chloride 103 98 - 111 mmol/L   CO2 28 22 - 32 mmol/L   Glucose, Bld 107 (H) 70 - 99 mg/dL    Comment: Glucose reference range applies only to samples taken after fasting for at least 8 hours.   BUN 9 6 - 20 mg/dL   Creatinine, Ser 4.74 0.44 - 1.00 mg/dL   Calcium 8.5 (L) 8.9 - 10.3 mg/dL   GFR, Estimated >25 >95 mL/min    Comment: (NOTE) Calculated using the CKD-EPI Creatinine Equation (2021)    Anion gap 5 5 - 15    Comment: Performed at [facility], [address], [place], [place] [address]  CBC with Differential  Status: Abnormal   Collection Time: 06/25/23 12:59 PM  Result Value Ref Range   WBC 6.7 4.0 - 10.5 K/uL   RBC 3.30 (L) 3.87 - 5.11 MIL/uL   Hemoglobin 5.8 (LL) 12.0 - 15.0 g/dL    Comment: REPEATED TO VERIFY Reticulocyte Hemoglobin testing may be clinically indicated, consider ordering this additional test [id number] THIS CRITICAL RESULT HAS VERIFIED AND BEEN CALLED TO ROBERTS,M RN BY AMANDA LEONARD ON 09 09 2024 AT 1353, AND HAS BEEN READ BACK.     HCT 22.6 (L) 36.0 - 46.0 %   MCV 68.5 (L) 80.0 - 100.0 fL   MCH 17.6 (L) 26.0 - 34.0 pg   MCHC 25.7 (L) 30.0 - 36.0 g/dL   RDW 30.8 (H) 65.7 - 84.6 %   Platelets 494 (H) 150 - 400 K/uL   nRBC 0.0 0.0 - 0.2 %   Neutrophils Relative % 58 %   Neutro Abs 3.9 1.7 - 7.7 K/uL   Lymphocytes Relative 31 %   Lymphs Abs 2.1 0.7 - 4.0 K/uL   Monocytes  Relative 9 %   Monocytes Absolute 0.6 0.1 - 1.0 K/uL   Eosinophils Relative 2 %   Eosinophils Absolute 0.2 0.0 - 0.5 K/uL   Basophils Relative 0 %   Basophils Absolute 0.0 0.0 - 0.1 K/uL   Immature Granulocytes 0 %   Abs Immature Granulocytes 0.01 0.00 - 0.07 K/uL   Polychromasia PRESENT     Comment: Performed at [facility], [address] [place], [place] [address]  Prepare RBC (crossmatch)     Status: None   Collection Time: 06/25/23  3:00 PM  Result Value Ref Range   Order Confirmation      ORDER PROCESSED BY BLOOD BANK Performed at [facility], [address], [place], [place] [address]    No results found.  Pending Labs Unresulted Labs (From admission, onward)     Start     Ordered   06/26/23 0500  CBC with Differential/Platelet  Daily,   R      06/25/23 1542   06/26/23 0500  Comprehensive metabolic panel  Tomorrow morning,   R        06/25/23 1542   06/25/23 1627  Occult blood card to lab, stool  Once,   R        06/25/23 1626   06/25/23 1626  HIV Antibody (routine testing w rflx)  (HIV Antibody (Routine testing w reflex) panel)  Once,   R        06/25/23 1625   06/25/23 1426  Vitamin B12  (Anemia Panel (PNL))  Once,   URGENT        06/25/23 1425   06/25/23 1426  Folate  (Anemia Panel (PNL))  Once,   URGENT        06/25/23 1425   06/25/23 1426  Iron and TIBC  (Anemia Panel (PNL))  Once,   URGENT        06/25/23 1425   06/25/23 1426  Ferritin  (Anemia Panel (PNL))  Once,   URGENT        06/25/23 1425   06/25/23 1426  Reticulocytes  (Anemia Panel (PNL))  Once,   URGENT        06/25/23 1425            Vitals/Pain Today's Vitals   06/25/23 1153 06/25/23 1216 06/25/23 1442  BP: (!) 144/90  (!) 151/78  Pulse: 90  76  Resp: 16  13  Temp: 99.2 F (37.3 C)  98.3 F (36.8 C)  TempSrc: Oral  Oral  SpO2: 100%  100%  Weight:  61.7 kg   Height:  5\' 6"  (1.676 m)   PainSc:  0-No pain     Isolation Precautions No active  isolations  Medications Medications  0.9 %  sodium chloride infusion (Manually program via Guardrails IV Fluids) (has no administration in time range)  bisacodyl (DULCOLAX) EC tablet 10 mg (has no administration in time range)  pantoprazole (PROTONIX) injection 40 mg (has no administration in time range)  peg 3350 powder (MOVIPREP) kit 100 g (has no administration in time range)    And  peg 3350 powder (MOVIPREP) kit 100 g (has no administration in time range)  B-12 TABS (has no administration in time range)  ferrous sulfate tablet 325 mg (has no administration in time range)  acetaminophen (TYLENOL) tablet 650 mg (has no administration in time range)    Or  acetaminophen (TYLENOL) suppository 650 mg (has no administration in time range)  ondansetron (ZOFRAN) tablet 4 mg (has no administration in time range)    Or  ondansetron (ZOFRAN) injection 4 mg (has no administration in time range)  hydrALAZINE (APRESOLINE) injection 5 mg (has no administration in time range)  amLODipine (NORVASC) tablet 5 mg (has no administration in time range)    Mobility walks     Focused Assessments Cardiac Assessment Handoff:    No results found for: "CKTOTAL", "CKMB", "CKMBINDEX", "TROPONINI" No results found for: "DDIMER" Does the Patient currently have chest pain? No    R Recommendations: See Admitting Provider Note  Report given to:   Additional Notes:

## 2023-06-25 NOTE — ED Provider Notes (Signed)
[facility] AT [facility] Provider Note   CSN: [id number] Arrival date & time: 06/25/23  1147     History  Chief Complaint  Patient presents with   Anemia    Faith Castillo is a 47 y.o. female.  Pt here due to low hb. Saw GI and told her hemoglobin was 6.3. Patient wiuth SOB, weakness. Iron def history. Still with irregular menstrual cycles. Patient with no black or bloody stools, nothing makes it worse or better. No fever of chill.  Denies any active chest pain or shortness of breath now.  Denies any blood thinners.  The history is provided by the patient.       Home Medications Prior to Admission medications   Medication Sig Start Date End Date Taking? Authorizing Provider  cephALEXin (KEFLEX) 500 MG capsule Take 1 capsule (500 mg total) by mouth 4 (four) times daily. 04/15/23   Al Decant, PA-C  Cyanocobalamin (B-12 PO) Take 1 tablet by mouth daily.    [provider]  ferrous sulfate 325 (65 FE) MG tablet Take 1 tablet (325 mg total) by mouth daily. Take with food 06/22/23   Esterwood, Amy S, PA-C  iron polysaccharides (NIFEREX) 150 MG capsule Take 1 capsule (150 mg total) by mouth daily. 01/07/21   Joseph Art, DO  methocarbamol (ROBAXIN) 500 MG tablet Take 1 tablet (500 mg total) by mouth at bedtime as needed for muscle spasms. Patient not taking: Reported on 06/22/2023 12/16/21   Merrilee Jansky, MD  Multiple Vitamins-Minerals (MULTIVITAMIN ADULT) CHEW Chew 1 tablet by mouth daily.    [provider]  Na Sulfate-K Sulfate-Mg Sulf (SUPREP BOWEL PREP KIT) 17.5-3.13-1.6 GM/177ML SOLN Take 1 kit by mouth as directed. 06/22/23   Esterwood, Amy S, PA-C  omeprazole (PRILOSEC) 40 MG capsule Take 1 capsule (40 mg total) by mouth every morning. 06/22/23   Esterwood, Amy S, PA-C  ondansetron (ZOFRAN-ODT) 4 MG disintegrating tablet Use 1 tablet every 6 to 8 hours as needed for nausea and vomiting 06/22/23   Esterwood, Amy S, PA-C   potassium chloride SA (KLOR-CON M) 20 MEQ tablet Take 1 tablet (20 mEq total) by mouth daily for 3 days. 06/05/22 06/08/22  Petrucelli, Samantha R, PA-C  sucralfate (CARAFATE) 1 GM/10ML suspension Take 10 mLs (1 g total) by mouth 4 (four) times daily -  with meals and at bedtime. Patient not taking: Reported on 06/22/2023 06/05/22   Petrucelli, Pleas Koch, PA-C      Allergies    Patient has no known allergies.    Review of Systems   Review of Systems  Physical Exam Updated Vital Signs BP (!) 151/78 (BP Location: Left Arm)   Pulse 76   Temp 98.3 F (36.8 C) (Oral)   Resp 13   Ht 5\' 6"  (1.676 m)   Wt 61.7 kg   LMP 06/18/2023 (Approximate)   SpO2 100%   BMI 21.95 kg/m  Physical Exam Vitals and nursing note reviewed.  Constitutional:      General: She is not in acute distress.    Appearance: She is well-developed.  HENT:     Head: Normocephalic and atraumatic.     Nose: Nose normal.     Mouth/Throat:     Mouth: Mucous membranes are moist.  Eyes:     Extraocular Movements: Extraocular movements intact.     Conjunctiva/sclera: Conjunctivae normal.     Pupils: Pupils are equal, round, and reactive to light.  Cardiovascular:  Rate and Rhythm: Normal rate and regular rhythm.     Pulses: Normal pulses.     Heart sounds: Normal heart sounds. No murmur heard. Pulmonary:     Effort: Pulmonary effort is normal. No respiratory distress.     Breath sounds: Normal breath sounds.  Abdominal:     Palpations: Abdomen is soft.     Tenderness: There is no abdominal tenderness.  Musculoskeletal:        General: No swelling.     Cervical back: Neck supple.  Skin:    General: Skin is warm and dry.     Capillary Refill: Capillary refill takes less than 2 seconds.  Neurological:     General: No focal deficit present.     Mental Status: She is alert.  Psychiatric:        Mood and Affect: Mood normal.     ED Results / Procedures / Treatments   Labs (all labs ordered are listed, but  only abnormal results are displayed) Labs Reviewed  BASIC METABOLIC PANEL - Abnormal; Notable for the following components:      Result Value   Glucose, Bld 107 (*)    Calcium 8.5 (*)    All other components within normal limits  CBC WITH DIFFERENTIAL/PLATELET - Abnormal; Notable for the following components:   RBC 3.30 (*)    Hemoglobin 5.8 (*)    HCT 22.6 (*)    MCV 68.5 (*)    MCH 17.6 (*)    MCHC 25.7 (*)    RDW 18.3 (*)    Platelets 494 (*)    All other components within normal limits  VITAMIN B12  FOLATE  IRON AND TIBC  FERRITIN  RETICULOCYTES  TYPE AND SCREEN  PREPARE RBC (CROSSMATCH)    EKG None  Radiology No results found.  Procedures .Critical Care  Performed by: Virgina Norfolk, DO Authorized by: Virgina Norfolk, DO   Critical care provider statement:    Critical care time (minutes):  35   Critical care was necessary to treat or prevent imminent or life-threatening deterioration of the following conditions:  Circulatory failure   Critical care was time spent personally by me on the following activities:  Blood draw for specimens, development of treatment plan with patient or surrogate, discussions with consultants, evaluation of patient's response to treatment, examination of patient, obtaining history from patient or surrogate, ordering and performing treatments and interventions, ordering and review of laboratory studies, pulse oximetry, ordering and review of radiographic studies, re-evaluation of patient's condition and review of old charts   I assumed direction of critical care for this patient from another provider in my specialty: no       Medications Ordered in ED Medications  0.9 %  sodium chloride infusion (Manually program via Guardrails IV Fluids) (has no administration in time range)    ED Course/ Medical Decision Making/ A&P                                 Medical Decision Making Amount and/or Complexity of Data Reviewed Labs:  ordered.  Risk Prescription drug management. Decision regarding hospitalization.   Steve Rattler patient here due to abnormal lab.  She was told by gastroenterology she had a hemoglobin of 6.3.  She has a history of iron deficiency anemia.  Has not noticed any black or bloody stools.  Still having fairly heavy and irregular periods.  Not on birth control.  Hemoglobin today is 5.8.  She had a stool today that was brown.  Overall vital signs are unremarkable.  Lab work per my review interpretation otherwise unremarkable.  Her MCV is 68 and overall does appear to be consistent with an iron deficiency anemia.  She was seen by GI 3 days ago who are planning on doing an outpatient EGD and colonoscopy.  I will engage [facility] to see if they would want to do that while she is inpatient.  Will have her admitted to medicine for blood transfusion and potential iron transfusion.  She is hemodynamically stable.  Admitted to medicine in stable condition.  This chart was dictated using voice recognition software.  Despite best efforts to proofread,  errors can occur which can change the documentation meaning.         Final Clinical Impression(s) / ED Diagnoses Final diagnoses:  Symptomatic anemia    Rx / DC Orders ED Discharge Orders     None         Virgina Norfolk, DO 06/25/23 1533

## 2023-06-25 NOTE — ED Provider Triage Note (Signed)
Emergency Medicine Provider Triage Evaluation Note  Faith Castillo , a 48 y.o. female  was evaluated in triage.  Pt complains of weakness and fatigue.  Patient saw her GI doctor and had labs done and was called and told her hemoglobin was 6.3.  Denies chest pain, shortness of breath, black or bloody stool.  She does have heavy menstrual cycles.  Review of Systems  Positive: As above Negative: As above  Physical Exam  BP (!) 144/90 (BP Location: Right Arm)   Pulse 90   Temp 99.2 F (37.3 C) (Oral)   Resp 16   Ht 5\' 6"  (1.676 m)   Wt 61.7 kg   LMP 06/18/2023 (Approximate)   SpO2 100%   BMI 21.95 kg/m  Gen:   Awake, no distress   Resp:  Normal effort  MSK:   Moves extremities without difficulty  Other:  Skin appears pale  Medical Decision Making  Medically screening exam initiated at 12:47 PM.  Appropriate orders placed.  Faith Castillo was informed that the remainder of the evaluation will be completed by another provider, this initial triage assessment does not replace that evaluation, and the importance of remaining in the ED until their evaluation is complete.     Melton Alar R, PA-C 06/25/23 1248

## 2023-06-25 NOTE — Plan of Care (Signed)
Pt admitted to 2 west. Pt alert and oriented x4, pt on room air, pt ambulatory but weak. Pt skin intact. Pt came up receiving first unit of blood. Pt on clears pt made aware to be NPO at midnight but pt will also start prep later for colonoscopy tomorrow. Pt placed on telemetry. Admission questions performed by admission RN. Vitals taken. Clear liquid tray ordered.

## 2023-06-25 NOTE — H&P (View-Only) (Signed)
Consultation  Referring Provider:   Dr. Lockie Mola Primary Care Physician:  Pcp, No Primary Gastroenterologist:  Dr. Chales Abrahams       Reason for Consultation:   IDA   DOA: 06/25/2023         Hospital Day: 1         HPI:   Faith Castillo is a 47 y.o. female with past medical history significant for hypertension, symptomatic anemia, weight loss.   9/6 patient seen in our office by Mike Gip, PA for longstanding history of IDA hospitalized 2022 Hgb 5.8 requiring transfusions PRBC and iron, Hemoccult negative at that time reporting heavy chronic menses and menorrhagia. Patient is continue to have microcytic anemia, not taking oral iron as she was advised, did not follow-up with GYN she advised.  Now presenting with nausea, vomiting, weight loss of unclear etiology in setting of heavy BC powder/Goody powder use last several months. Patient denies overt GI bleeding, no melena or hematochezia. Patient scheduled for outpatient EGD and colonoscopy for IDA 08/24/2023 with Dr. Chales Abrahams, though established with Dr. Leonides Schanz. 9/6 labs showed CBC 6.3 down 1 unit from 2 months ago, 2 units from a year ago, MCV 62, platelets 487, WBC 7.3, iron 10, iron saturation 3, ferritin 2.4 B12 688, thyroid normal, folate normal BUN 10, creatinine 0.58 no acute elevation. Patient was not having any anemia symptoms, advised to go to the ER if any worsening symptoms.  Presents to the ER with with weakness, states she was referred for blood product from her GI office. Hemoglobin 6.3 with Amy Esterwood 9/6, Hgb 5.8 on admission. MCV 68.5, platelets 494, WBC 6.7 No acute elevation of BUN.  No family was present at the time of my evaluation. Patient states she has had some weakness but denies any chest pain or shortness of breath. She just started her menstrual period 9/2, and is still having light menses, has 7-day a large volume menstrual periods, states she bleeds through a pad every 2-3 hours and has to change, this  will last for at least 3 to 4 days of her menstrual period. Patient states since she has turned 39 she has been having heavier like clots in her menstrual period, had Esher no longer on birth control. Patient states her last bowel movement was Thursday morning, denies constipation or diarrhea.  Denies any melena or hematochezia. Has had some intermittent nausea, last time was in June had some nausea and vomiting.  She has had some weight loss. Patient stopped Goody powders as instructed from a knee, has not had time to pick up the oral iron or proton pump inhibitor. 80 pound weight loss over the last 3 to 4 years Denies history of tobacco use or smoking. 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  Abnormal ED labs: Abnormal Labs Reviewed  BASIC METABOLIC PANEL - Abnormal; Notable for the following components:      Result Value   Glucose, Bld 107 (*)    Calcium 8.5 (*)    All other components within normal limits  CBC WITH DIFFERENTIAL/PLATELET - Abnormal; Notable for the following components:   RBC 3.30 (*)    Hemoglobin 5.8 (*)    HCT 22.6 (*)    MCV 68.5 (*)    MCH 17.6 (*)    MCHC 25.7 (*)    RDW 18.3 (*)    Platelets 494 (*)    All other components within normal limits    Past Medical  History:  Diagnosis Date   Blood transfusion without reported diagnosis    Hypertension     Surgical History:  She  has no past surgical history on file. Family History:  Her family history includes Hypertension in her father and mother. Social History:   reports that she has never smoked. She has never used smokeless tobacco. She reports that she does not drink alcohol and does not use drugs.  Prior to Admission medications   Medication Sig Start Date End Date Taking? Authorizing Provider  cephALEXin (KEFLEX) 500 MG capsule Take 1 capsule (500 mg total) by mouth 4 (four) times daily. 04/15/23   Al Decant, PA-C  Cyanocobalamin (B-12 PO) Take 1 tablet by mouth daily.     [provider]  ferrous sulfate 325 (65 FE) MG tablet Take 1 tablet (325 mg total) by mouth daily. Take with food 06/22/23   Esterwood, Amy S, PA-C  iron polysaccharides (NIFEREX) 150 MG capsule Take 1 capsule (150 mg total) by mouth daily. 01/07/21   Joseph Art, DO  methocarbamol (ROBAXIN) 500 MG tablet Take 1 tablet (500 mg total) by mouth at bedtime as needed for muscle spasms. Patient not taking: Reported on 06/22/2023 12/16/21   Merrilee Jansky, MD  Multiple Vitamins-Minerals (MULTIVITAMIN ADULT) CHEW Chew 1 tablet by mouth daily.    [provider]  Na Sulfate-K Sulfate-Mg Sulf (SUPREP BOWEL PREP KIT) 17.5-3.13-1.6 GM/177ML SOLN Take 1 kit by mouth as directed. 06/22/23   Esterwood, Amy S, PA-C  omeprazole (PRILOSEC) 40 MG capsule Take 1 capsule (40 mg total) by mouth every morning. 06/22/23   Esterwood, Amy S, PA-C  ondansetron (ZOFRAN-ODT) 4 MG disintegrating tablet Use 1 tablet every 6 to 8 hours as needed for nausea and vomiting 06/22/23   Esterwood, Amy S, PA-C  potassium chloride SA (KLOR-CON M) 20 MEQ tablet Take 1 tablet (20 mEq total) by mouth daily for 3 days. 06/05/22 06/08/22  Petrucelli, Samantha R, PA-C  sucralfate (CARAFATE) 1 GM/10ML suspension Take 10 mLs (1 g total) by mouth 4 (four) times daily -  with meals and at bedtime. Patient not taking: Reported on 06/22/2023 06/05/22   Petrucelli, Pleas Koch, PA-C    Current Facility-Administered Medications  Medication Dose Route Frequency Provider Last Rate Last Admin   0.9 %  sodium chloride infusion (Manually program via Guardrails IV Fluids)   Intravenous Once Virgina Norfolk, DO       Current Outpatient Medications  Medication Sig Dispense Refill   cephALEXin (KEFLEX) 500 MG capsule Take 1 capsule (500 mg total) by mouth 4 (four) times daily. 28 capsule 0   Cyanocobalamin (B-12 PO) Take 1 tablet by mouth daily.     ferrous sulfate 325 (65 FE) MG tablet Take 1 tablet (325 mg total) by mouth daily. Take with food 60  tablet 6   iron polysaccharides (NIFEREX) 150 MG capsule Take 1 capsule (150 mg total) by mouth daily.     methocarbamol (ROBAXIN) 500 MG tablet Take 1 tablet (500 mg total) by mouth at bedtime as needed for muscle spasms. (Patient not taking: Reported on 06/22/2023) 20 tablet 0   Multiple Vitamins-Minerals (MULTIVITAMIN ADULT) CHEW Chew 1 tablet by mouth daily.     Na Sulfate-K Sulfate-Mg Sulf (SUPREP BOWEL PREP KIT) 17.5-3.13-1.6 GM/177ML SOLN Take 1 kit by mouth as directed. 324 mL 0   omeprazole (PRILOSEC) 40 MG capsule Take 1 capsule (40 mg total) by mouth every morning. 30 capsule 11   ondansetron (ZOFRAN-ODT) 4  MG disintegrating tablet Use 1 tablet every 6 to 8 hours as needed for nausea and vomiting 60 tablet 3   potassium chloride SA (KLOR-CON M) 20 MEQ tablet Take 1 tablet (20 mEq total) by mouth daily for 3 days. 3 tablet 0   sucralfate (CARAFATE) 1 GM/10ML suspension Take 10 mLs (1 g total) by mouth 4 (four) times daily -  with meals and at bedtime. (Patient not taking: Reported on 06/22/2023) 420 mL 0    Allergies as of 06/25/2023   (No Known Allergies)    Review of Systems:    Constitutional: No weight loss, fever, chills, weakness or fatigue HEENT: Eyes: No change in vision               Ears, Nose, Throat:  No change in hearing or congestion Skin: No rash or itching Cardiovascular: No chest pain, chest pressure or palpitations   Respiratory: No SOB or cough Gastrointestinal: See HPI and otherwise negative Genitourinary: No dysuria or change in urinary frequency Neurological: No headache, dizziness or syncope Musculoskeletal: No new muscle or joint pain Hematologic: No bleeding or bruising Psychiatric: No history of depression or anxiety     Physical Exam:  Vital signs in last 24 hours: Temp:  [99.2 F (37.3 C)] 99.2 F (37.3 C) (09/09 1153) Pulse Rate:  [90] 90 (09/09 1153) Resp:  [16] 16 (09/09 1153) BP: (144)/(90) 144/90 (09/09 1153) SpO2:  [100 %] 100 % (09/09  1153) Weight:  [61.7 kg] 61.7 kg (09/09 1216)   Last BM recorded by nurses in past 5 days No data recorded  General:   Pleasant, well developed female in no acute distress Head:  Normocephalic and atraumatic.  Dry oral mucosa Eyes: sclerae anicteric,conjunctive pale  Heart:  regular rate and rhythm, no murmurs or gallops Pulm: Clear anteriorly; no wheezing Abdomen:  Soft, Flat AB, Active bowel sounds. No tenderness, No organomegaly appreciated. Extremities:  Without edema. Msk:  Symmetrical without gross deformities. Peripheral pulses intact.  Neurologic:  Alert and  oriented x4;  No focal deficits.  Skin:   Dry and intact without significant lesions or rashes. Psychiatric:  Cooperative. Normal mood and affect.  LAB RESULTS: Recent Labs    06/25/23 1259  WBC 6.7  HGB 5.8*  HCT 22.6*  PLT 494*   BMET Recent Labs    06/25/23 1259  NA 136  K 3.5  CL 103  CO2 28  GLUCOSE 107*  BUN 9  CREATININE 0.61  CALCIUM 8.5*   LFT No results for input(s): "PROT", "ALBUMIN", "AST", "ALT", "ALKPHOS", "BILITOT", "BILIDIR", "IBILI" in the last 72 hours. PT/INR No results for input(s): "LABPROT", "INR" in the last 72 hours.  STUDIES: No results found.    Impression     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 Hemoccult at that time thought to be secondary to menorrhagia , no overt GI bleeding Some nausea, vomiting, weight loss within the last year in setting of NSAIDs   HGB  outpatient 6.3, compared to 7.3 a month prior, repeat 5.8  06/22/2023 Iron 10 Ferritin 2.4 B12 688 Patient has just had a week long menstrual cycle Scheduled for EGD colonoscopy November 8 Recent Labs    04/15/23 1536 06/22/23 1228 06/25/23 1259  HGB 7.3* 6.3 Repeated and verified X2.* 5.8*   GERD History of BC/Goody powder use, directed to stop Never picked up PPI  Menorrhagia Suggest follow-up outpatient with GYN  History of hypertension  Active Problems:   * No active hospital problems. *  LOS: 0 days      Plan   Very possible this is secondary to menorrhagia however with worsening nausea, vomiting and patient's age suggest endoscopic evaluation with EGD and colonoscopy. -Supportive care with blood transfusion keep Hgb greater than 7 -Patient would likely benefit from iron infusion this visit, delay oral iron  --Moviprep, clear liquid diet, NPO at midnight. --Give 10 mg PO Dulcolax today -Please obtain early AM CBC, CMET, INR  - Protonix 40 mg BID IV  Thank you for your kind consultation, we will continue to follow.   Doree Albee  06/25/2023, 2:23 PM   Attending physician's note  I have taken a history, reviewed the chart and examined the patient. I performed a substantive portion of this encounter, including complete performance of at least one of the key components, in conjunction with the APP. I agree with the APP's note, impression and recommendations.   47 year old very pleasant female with severe iron deficiency anemia, hemoglobin 5.8 on admission History of menorrhagia but will need to exclude additional GI blood loss leading to severe iron deficiency anemia Plan to proceed with EGD and colonoscopy for further evaluation, exclude gastroduodenal ulcer or neoplastic lesion Avoid NSAID's Transfuse to hemoglobin >7 IV iron infusion Clear liquid diet and bowel prep N.p.o. after 5 AM tomorrow morning   The patient was provided an opportunity to ask questions and all were answered. The patient agreed with the plan and demonstrated an understanding of the instructions.  Iona Beard , MD [phone]

## 2023-06-25 NOTE — ED Notes (Signed)
Blood consent signed at bedside.  ?

## 2023-06-25 NOTE — ED Triage Notes (Signed)
Pt states she was called on Friday by her Gi doctor stating her hgb 6.3 and her iron was non existent. Pt c.o fatigue.

## 2023-06-25 NOTE — Progress Notes (Signed)
I agree with the assessment and plan as outlined by Ms. Esterwood. ?

## 2023-06-25 NOTE — Telephone Encounter (Signed)
See result note.  

## 2023-06-25 NOTE — Consult Note (Addendum)
Consultation  Referring Provider:   Dr. Lockie Mola Primary Care Physician:  Pcp, No Primary Gastroenterologist:  Dr. Chales Abrahams       Reason for Consultation:   IDA   DOA: 06/25/2023         Hospital Day: 1         HPI:   Faith Castillo is a 47 y.o. female with past medical history significant for hypertension, symptomatic anemia, weight loss.   9/6 patient seen in our office by Mike Gip, PA for longstanding history of IDA hospitalized 2022 Hgb 5.8 requiring transfusions PRBC and iron, Hemoccult negative at that time reporting heavy chronic menses and menorrhagia. Patient is continue to have microcytic anemia, not taking oral iron as she was advised, did not follow-up with GYN she advised.  Now presenting with nausea, vomiting, weight loss of unclear etiology in setting of heavy BC powder/Goody powder use last several months. Patient denies overt GI bleeding, no melena or hematochezia. Patient scheduled for outpatient EGD and colonoscopy for IDA 08/24/2023 with Dr. Chales Abrahams, though established with Dr. Leonides Schanz. 9/6 labs showed CBC 6.3 down 1 unit from 2 months ago, 2 units from a year ago, MCV 62, platelets 487, WBC 7.3, iron 10, iron saturation 3, ferritin 2.4 B12 688, thyroid normal, folate normal BUN 10, creatinine 0.58 no acute elevation. Patient was not having any anemia symptoms, advised to go to the ER if any worsening symptoms.  Presents to the ER with with weakness, states she was referred for blood product from her GI office. Hemoglobin 6.3 with Amy Esterwood 9/6, Hgb 5.8 on admission. MCV 68.5, platelets 494, WBC 6.7 No acute elevation of BUN.  No family was present at the time of my evaluation. Patient states she has had some weakness but denies any chest pain or shortness of breath. She just started her menstrual period 9/2, and is still having light menses, has 7-day a large volume menstrual periods, states she bleeds through a pad every 2-3 hours and has to change, this  will last for at least 3 to 4 days of her menstrual period. Patient states since she has turned 39 she has been having heavier like clots in her menstrual period, had Esher no longer on birth control. Patient states her last bowel movement was Thursday morning, denies constipation or diarrhea.  Denies any melena or hematochezia. Has had some intermittent nausea, last time was in June had some nausea and vomiting.  She has had some weight loss. Patient stopped Goody powders as instructed from a knee, has not had time to pick up the oral iron or proton pump inhibitor. 80 pound weight loss over the last 3 to 4 years Denies history of tobacco use or smoking. 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  Abnormal ED labs: Abnormal Labs Reviewed  BASIC METABOLIC PANEL - Abnormal; Notable for the following components:      Result Value   Glucose, Bld 107 (*)    Calcium 8.5 (*)    All other components within normal limits  CBC WITH DIFFERENTIAL/PLATELET - Abnormal; Notable for the following components:   RBC 3.30 (*)    Hemoglobin 5.8 (*)    HCT 22.6 (*)    MCV 68.5 (*)    MCH 17.6 (*)    MCHC 25.7 (*)    RDW 18.3 (*)    Platelets 494 (*)    All other components within normal limits    Past Medical  History:  Diagnosis Date   Blood transfusion without reported diagnosis    Hypertension     Surgical History:  She  has no past surgical history on file. Family History:  Her family history includes Hypertension in her father and mother. Social History:   reports that she has never smoked. She has never used smokeless tobacco. She reports that she does not drink alcohol and does not use drugs.  Prior to Admission medications   Medication Sig Start Date End Date Taking? Authorizing Provider  cephALEXin (KEFLEX) 500 MG capsule Take 1 capsule (500 mg total) by mouth 4 (four) times daily. 04/15/23   Al Decant, PA-C  Cyanocobalamin (B-12 PO) Take 1 tablet by mouth daily.     [provider]  ferrous sulfate 325 (65 FE) MG tablet Take 1 tablet (325 mg total) by mouth daily. Take with food 06/22/23   Esterwood, Amy S, PA-C  iron polysaccharides (NIFEREX) 150 MG capsule Take 1 capsule (150 mg total) by mouth daily. 01/07/21   Joseph Art, DO  methocarbamol (ROBAXIN) 500 MG tablet Take 1 tablet (500 mg total) by mouth at bedtime as needed for muscle spasms. Patient not taking: Reported on 06/22/2023 12/16/21   Merrilee Jansky, MD  Multiple Vitamins-Minerals (MULTIVITAMIN ADULT) CHEW Chew 1 tablet by mouth daily.    [provider]  Na Sulfate-K Sulfate-Mg Sulf (SUPREP BOWEL PREP KIT) 17.5-3.13-1.6 GM/177ML SOLN Take 1 kit by mouth as directed. 06/22/23   Esterwood, Amy S, PA-C  omeprazole (PRILOSEC) 40 MG capsule Take 1 capsule (40 mg total) by mouth every morning. 06/22/23   Esterwood, Amy S, PA-C  ondansetron (ZOFRAN-ODT) 4 MG disintegrating tablet Use 1 tablet every 6 to 8 hours as needed for nausea and vomiting 06/22/23   Esterwood, Amy S, PA-C  potassium chloride SA (KLOR-CON M) 20 MEQ tablet Take 1 tablet (20 mEq total) by mouth daily for 3 days. 06/05/22 06/08/22  Petrucelli, Samantha R, PA-C  sucralfate (CARAFATE) 1 GM/10ML suspension Take 10 mLs (1 g total) by mouth 4 (four) times daily -  with meals and at bedtime. Patient not taking: Reported on 06/22/2023 06/05/22   Petrucelli, Pleas Koch, PA-C    Current Facility-Administered Medications  Medication Dose Route Frequency Provider Last Rate Last Admin   0.9 %  sodium chloride infusion (Manually program via Guardrails IV Fluids)   Intravenous Once Virgina Norfolk, DO       Current Outpatient Medications  Medication Sig Dispense Refill   cephALEXin (KEFLEX) 500 MG capsule Take 1 capsule (500 mg total) by mouth 4 (four) times daily. 28 capsule 0   Cyanocobalamin (B-12 PO) Take 1 tablet by mouth daily.     ferrous sulfate 325 (65 FE) MG tablet Take 1 tablet (325 mg total) by mouth daily. Take with food 60  tablet 6   iron polysaccharides (NIFEREX) 150 MG capsule Take 1 capsule (150 mg total) by mouth daily.     methocarbamol (ROBAXIN) 500 MG tablet Take 1 tablet (500 mg total) by mouth at bedtime as needed for muscle spasms. (Patient not taking: Reported on 06/22/2023) 20 tablet 0   Multiple Vitamins-Minerals (MULTIVITAMIN ADULT) CHEW Chew 1 tablet by mouth daily.     Na Sulfate-K Sulfate-Mg Sulf (SUPREP BOWEL PREP KIT) 17.5-3.13-1.6 GM/177ML SOLN Take 1 kit by mouth as directed. 324 mL 0   omeprazole (PRILOSEC) 40 MG capsule Take 1 capsule (40 mg total) by mouth every morning. 30 capsule 11   ondansetron (ZOFRAN-ODT) 4  MG disintegrating tablet Use 1 tablet every 6 to 8 hours as needed for nausea and vomiting 60 tablet 3   potassium chloride SA (KLOR-CON M) 20 MEQ tablet Take 1 tablet (20 mEq total) by mouth daily for 3 days. 3 tablet 0   sucralfate (CARAFATE) 1 GM/10ML suspension Take 10 mLs (1 g total) by mouth 4 (four) times daily -  with meals and at bedtime. (Patient not taking: Reported on 06/22/2023) 420 mL 0    Allergies as of 06/25/2023   (No Known Allergies)    Review of Systems:    Constitutional: No weight loss, fever, chills, weakness or fatigue HEENT: Eyes: No change in vision               Ears, Nose, Throat:  No change in hearing or congestion Skin: No rash or itching Cardiovascular: No chest pain, chest pressure or palpitations   Respiratory: No SOB or cough Gastrointestinal: See HPI and otherwise negative Genitourinary: No dysuria or change in urinary frequency Neurological: No headache, dizziness or syncope Musculoskeletal: No new muscle or joint pain Hematologic: No bleeding or bruising Psychiatric: No history of depression or anxiety     Physical Exam:  Vital signs in last 24 hours: Temp:  [99.2 F (37.3 C)] 99.2 F (37.3 C) (09/09 1153) Pulse Rate:  [90] 90 (09/09 1153) Resp:  [16] 16 (09/09 1153) BP: (144)/(90) 144/90 (09/09 1153) SpO2:  [100 %] 100 % (09/09  1153) Weight:  [61.7 kg] 61.7 kg (09/09 1216)   Last BM recorded by nurses in past 5 days No data recorded  General:   Pleasant, well developed female in no acute distress Head:  Normocephalic and atraumatic.  Dry oral mucosa Eyes: sclerae anicteric,conjunctive pale  Heart:  regular rate and rhythm, no murmurs or gallops Pulm: Clear anteriorly; no wheezing Abdomen:  Soft, Flat AB, Active bowel sounds. No tenderness, No organomegaly appreciated. Extremities:  Without edema. Msk:  Symmetrical without gross deformities. Peripheral pulses intact.  Neurologic:  Alert and  oriented x4;  No focal deficits.  Skin:   Dry and intact without significant lesions or rashes. Psychiatric:  Cooperative. Normal mood and affect.  LAB RESULTS: Recent Labs    06/25/23 1259  WBC 6.7  HGB 5.8*  HCT 22.6*  PLT 494*   BMET Recent Labs    06/25/23 1259  NA 136  K 3.5  CL 103  CO2 28  GLUCOSE 107*  BUN 9  CREATININE 0.61  CALCIUM 8.5*   LFT No results for input(s): "PROT", "ALBUMIN", "AST", "ALT", "ALKPHOS", "BILITOT", "BILIDIR", "IBILI" in the last 72 hours. PT/INR No results for input(s): "LABPROT", "INR" in the last 72 hours.  STUDIES: No results found.    Impression     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 Hemoccult at that time thought to be secondary to menorrhagia , no overt GI bleeding Some nausea, vomiting, weight loss within the last year in setting of NSAIDs   HGB  outpatient 6.3, compared to 7.3 a month prior, repeat 5.8  06/22/2023 Iron 10 Ferritin 2.4 B12 688 Patient has just had a week long menstrual cycle Scheduled for EGD colonoscopy November 8 Recent Labs    04/15/23 1536 06/22/23 1228 06/25/23 1259  HGB 7.3* 6.3 Repeated and verified X2.* 5.8*   GERD History of BC/Goody powder use, directed to stop Never picked up PPI  Menorrhagia Suggest follow-up outpatient with GYN  History of hypertension  Active Problems:   * No active hospital problems. *  LOS: 0 days      Plan   Very possible this is secondary to menorrhagia however with worsening nausea, vomiting and patient's age suggest endoscopic evaluation with EGD and colonoscopy. -Supportive care with blood transfusion keep Hgb greater than 7 -Patient would likely benefit from iron infusion this visit, delay oral iron  --Moviprep, clear liquid diet, NPO at midnight. --Give 10 mg PO Dulcolax today -Please obtain early AM CBC, CMET, INR  - Protonix 40 mg BID IV  Thank you for your kind consultation, we will continue to follow.   Doree Albee  06/25/2023, 2:23 PM   Attending physician's note  I have taken a history, reviewed the chart and examined the patient. I performed a substantive portion of this encounter, including complete performance of at least one of the key components, in conjunction with the APP. I agree with the APP's note, impression and recommendations.   47 year old very pleasant female with severe iron deficiency anemia, hemoglobin 5.8 on admission History of menorrhagia but will need to exclude additional GI blood loss leading to severe iron deficiency anemia Plan to proceed with EGD and colonoscopy for further evaluation, exclude gastroduodenal ulcer or neoplastic lesion Avoid NSAID's Transfuse to hemoglobin >7 IV iron infusion Clear liquid diet and bowel prep N.p.o. after 5 AM tomorrow morning   The patient was provided an opportunity to ask questions and all were answered. The patient agreed with the plan and demonstrated an understanding of the instructions.  Iona Beard , MD [phone]

## 2023-06-26 ENCOUNTER — Observation Stay (HOSPITAL_COMMUNITY): Payer: BC Managed Care – PPO | Admitting: Certified Registered"

## 2023-06-26 ENCOUNTER — Encounter (HOSPITAL_COMMUNITY): Payer: Self-pay | Admitting: Internal Medicine

## 2023-06-26 ENCOUNTER — Encounter (HOSPITAL_COMMUNITY)
Admission: EM | Disposition: A | Discharge status: Home / Self Care | Payer: Self-pay | Source: Home / Self Care | Attending: Internal Medicine

## 2023-06-26 DIAGNOSIS — K254 Chronic or unspecified gastric ulcer with hemorrhage: Secondary | ICD-10-CM | POA: Diagnosis present

## 2023-06-26 DIAGNOSIS — B9681 Helicobacter pylori [H. pylori] as the cause of diseases classified elsewhere: Secondary | ICD-10-CM | POA: Diagnosis present

## 2023-06-26 DIAGNOSIS — K921 Melena: Secondary | ICD-10-CM | POA: Diagnosis not present

## 2023-06-26 DIAGNOSIS — T454X6A Underdosing of iron and its compounds, initial encounter: Secondary | ICD-10-CM | POA: Diagnosis present

## 2023-06-26 DIAGNOSIS — K219 Gastro-esophageal reflux disease without esophagitis: Secondary | ICD-10-CM | POA: Diagnosis present

## 2023-06-26 DIAGNOSIS — Z91128 Patient's intentional underdosing of medication regimen for other reason: Secondary | ICD-10-CM | POA: Diagnosis not present

## 2023-06-26 DIAGNOSIS — K264 Chronic or unspecified duodenal ulcer with hemorrhage: Secondary | ICD-10-CM | POA: Diagnosis present

## 2023-06-26 DIAGNOSIS — K922 Gastrointestinal hemorrhage, unspecified: Secondary | ICD-10-CM | POA: Diagnosis present

## 2023-06-26 DIAGNOSIS — K2970 Gastritis, unspecified, without bleeding: Secondary | ICD-10-CM | POA: Diagnosis not present

## 2023-06-26 DIAGNOSIS — Z79899 Other long term (current) drug therapy: Secondary | ICD-10-CM | POA: Diagnosis not present

## 2023-06-26 DIAGNOSIS — K2961 Other gastritis with bleeding: Secondary | ICD-10-CM | POA: Diagnosis present

## 2023-06-26 DIAGNOSIS — N920 Excessive and frequent menstruation with regular cycle: Secondary | ICD-10-CM | POA: Diagnosis present

## 2023-06-26 DIAGNOSIS — B3781 Candidal esophagitis: Secondary | ICD-10-CM | POA: Diagnosis present

## 2023-06-26 DIAGNOSIS — I10 Essential (primary) hypertension: Secondary | ICD-10-CM | POA: Diagnosis present

## 2023-06-26 DIAGNOSIS — T39395A Adverse effect of other nonsteroidal anti-inflammatory drugs [NSAID], initial encounter: Secondary | ICD-10-CM | POA: Diagnosis present

## 2023-06-26 DIAGNOSIS — D649 Anemia, unspecified: Secondary | ICD-10-CM | POA: Diagnosis present

## 2023-06-26 DIAGNOSIS — K269 Duodenal ulcer, unspecified as acute or chronic, without hemorrhage or perforation: Secondary | ICD-10-CM | POA: Diagnosis not present

## 2023-06-26 DIAGNOSIS — Z8249 Family history of ischemic heart disease and other diseases of the circulatory system: Secondary | ICD-10-CM | POA: Diagnosis not present

## 2023-06-26 DIAGNOSIS — D509 Iron deficiency anemia, unspecified: Secondary | ICD-10-CM | POA: Diagnosis present

## 2023-06-26 HISTORY — PX: HOT HEMOSTASIS: SHX5433

## 2023-06-26 HISTORY — PX: SCLEROTHERAPY: SHX6841

## 2023-06-26 HISTORY — PX: HEMOSTASIS CLIP PLACEMENT: SHX6857

## 2023-06-26 HISTORY — PX: BIOPSY: SHX5522

## 2023-06-26 HISTORY — PX: HEMOSTASIS CONTROL: SHX6838

## 2023-06-26 HISTORY — PX: ESOPHAGOGASTRODUODENOSCOPY (EGD) WITH PROPOFOL: SHX5813

## 2023-06-26 LAB — BPAM RBC
Blood Product Expiration Date: 202409152359
Blood Product Expiration Date: 202410032359
ISSUE DATE / TIME: 202409091801
ISSUE DATE / TIME: 202409092059
Unit Type and Rh: 6200
Unit Type and Rh: 6200

## 2023-06-26 LAB — TYPE AND SCREEN
ABO/RH(D): A POS
Antibody Screen: NEGATIVE
Unit division: 0
Unit division: 0

## 2023-06-26 LAB — CBC WITH DIFFERENTIAL/PLATELET
Abs Immature Granulocytes: 0.02 10*3/uL (ref 0.00–0.07)
Basophils Absolute: 0 10*3/uL (ref 0.0–0.1)
Basophils Relative: 0 %
Eosinophils Absolute: 0.2 10*3/uL (ref 0.0–0.5)
Eosinophils Relative: 2 %
HCT: 29 % — ABNORMAL LOW (ref 36.0–46.0)
Hemoglobin: 8.3 g/dL — ABNORMAL LOW (ref 12.0–15.0)
Immature Granulocytes: 0 %
Lymphocytes Relative: 25 %
Lymphs Abs: 1.8 10*3/uL (ref 0.7–4.0)
MCH: 19.7 pg — ABNORMAL LOW (ref 26.0–34.0)
MCHC: 28.6 g/dL — ABNORMAL LOW (ref 30.0–36.0)
MCV: 68.9 fL — ABNORMAL LOW (ref 80.0–100.0)
Monocytes Absolute: 0.7 10*3/uL (ref 0.1–1.0)
Monocytes Relative: 9 %
Neutro Abs: 4.7 10*3/uL (ref 1.7–7.7)
Neutrophils Relative %: 64 %
Platelets: 436 10*3/uL — ABNORMAL HIGH (ref 150–400)
RBC: 4.21 MIL/uL (ref 3.87–5.11)
RDW: 20 % — ABNORMAL HIGH (ref 11.5–15.5)
WBC: 7.4 10*3/uL (ref 4.0–10.5)
nRBC: 0 % (ref 0.0–0.2)

## 2023-06-26 LAB — COMPREHENSIVE METABOLIC PANEL
ALT: 10 U/L (ref 0–44)
AST: 15 U/L (ref 15–41)
Albumin: 3.2 g/dL — ABNORMAL LOW (ref 3.5–5.0)
Alkaline Phosphatase: 45 U/L (ref 38–126)
Anion gap: 8 (ref 5–15)
BUN: 5 mg/dL — ABNORMAL LOW (ref 6–20)
CO2: 22 mmol/L (ref 22–32)
Calcium: 8.6 mg/dL — ABNORMAL LOW (ref 8.9–10.3)
Chloride: 108 mmol/L (ref 98–111)
Creatinine, Ser: 0.61 mg/dL (ref 0.44–1.00)
GFR, Estimated: >60 mL/min (ref >60)
Glucose, Bld: 89 mg/dL (ref 70–99)
Potassium: 3.8 mmol/L (ref 3.5–5.1)
Sodium: 138 mmol/L (ref 135–145)
Total Bilirubin: 1 mg/dL (ref 0.3–1.2)
Total Protein: 6.9 g/dL (ref 6.5–8.1)

## 2023-06-26 LAB — HEMOGLOBIN AND HEMATOCRIT, BLOOD
HCT: 33.5 % — ABNORMAL LOW (ref 36.0–46.0)
HCT: 31.4 % — ABNORMAL LOW (ref 36.0–46.0)
Hemoglobin: 9.6 g/dL — ABNORMAL LOW (ref 12.0–15.0)
Hemoglobin: 9.1 g/dL — ABNORMAL LOW (ref 12.0–15.0)

## 2023-06-26 SURGERY — ESOPHAGOGASTRODUODENOSCOPY (EGD) WITH PROPOFOL
Anesthesia: Monitor Anesthesia Care

## 2023-06-26 SURGERY — ESOPHAGOGASTRODUODENOSCOPY (EGD) WITH PROPOFOL
Anesthesia: General

## 2023-06-26 MED ORDER — LIDOCAINE 2% (20 MG/ML) 5 ML SYRINGE
INTRAMUSCULAR | Status: DC | PRN
  Administered 2023-06-26: 100 mg via INTRAVENOUS

## 2023-06-26 MED ORDER — PROPOFOL 10 MG/ML IV BOLUS
INTRAVENOUS | Status: DC | PRN
Start: 2023-06-26 — End: 2023-06-26
  Administered 2023-06-26: 50 mg via INTRAVENOUS
  Administered 2023-06-26: 80 mg via INTRAVENOUS

## 2023-06-26 MED ORDER — AMLODIPINE BESYLATE 10 MG PO TABS
10.0000 mg | ORAL_TABLET | Freq: Every day | ORAL | Status: DC
  Administered 2023-06-27 – 2023-06-28 (×2): 10 mg via ORAL
  Filled 2023-06-26 (×2): qty 1

## 2023-06-26 MED ORDER — PROPOFOL 500 MG/50ML IV EMUL
INTRAVENOUS | Status: DC | PRN
  Administered 2023-06-26: 150 ug/kg/min via INTRAVENOUS

## 2023-06-26 MED ORDER — PROPOFOL 10 MG/ML IV BOLUS
INTRAVENOUS | Status: DC | PRN
  Administered 2023-06-26: 150 mg via INTRAVENOUS

## 2023-06-26 MED ORDER — SUCCINYLCHOLINE CHLORIDE 200 MG/10ML IV SOSY
PREFILLED_SYRINGE | INTRAVENOUS | Status: DC | PRN
  Administered 2023-06-26: 100 mg via INTRAVENOUS

## 2023-06-26 MED ORDER — SODIUM CHLORIDE 0.9 % IV SOLN: INTRAVENOUS | Status: DC

## 2023-06-26 MED ORDER — LACTATED RINGERS IV SOLN: INTRAVENOUS | Status: DC

## 2023-06-26 MED ORDER — LIDOCAINE 2% (20 MG/ML) 5 ML SYRINGE
INTRAMUSCULAR | Status: DC | PRN
  Administered 2023-06-26: 80 mg via INTRAVENOUS

## 2023-06-26 MED ORDER — FLUCONAZOLE IN SODIUM CHLORIDE 400-0.9 MG/200ML-% IV SOLN
400.0000 mg | Freq: Once | INTRAVENOUS | Status: AC
  Administered 2023-06-26: 400 mg via INTRAVENOUS
  Filled 2023-06-26: qty 200

## 2023-06-26 MED ORDER — HYDRALAZINE HCL 20 MG/ML IJ SOLN: 10.0000 mg | INTRAMUSCULAR | Status: DC | PRN

## 2023-06-26 MED ORDER — SODIUM CHLORIDE 0.9 % IV SOLN
250.0000 mg | Freq: Every day | INTRAVENOUS | Status: AC
  Administered 2023-06-26 – 2023-06-27 (×2): 250 mg via INTRAVENOUS
  Filled 2023-06-26 (×2): qty 20

## 2023-06-26 MED ORDER — ONDANSETRON HCL 4 MG/2ML IJ SOLN
INTRAMUSCULAR | Status: AC
  Filled 2023-06-26: qty 2

## 2023-06-26 MED ORDER — FLUCONAZOLE 200 MG PO TABS
200.0000 mg | ORAL_TABLET | Freq: Every day | ORAL | Status: DC
  Administered 2023-06-27 – 2023-06-28 (×2): 200 mg via ORAL
  Filled 2023-06-26 (×2): qty 1

## 2023-06-26 MED ORDER — DEXAMETHASONE SODIUM PHOSPHATE 10 MG/ML IJ SOLN
INTRAMUSCULAR | Status: DC | PRN
  Administered 2023-06-26: 5 mg via INTRAVENOUS

## 2023-06-26 MED ORDER — SODIUM CHLORIDE (PF) 0.9 % IJ SOLN
PREFILLED_SYRINGE | INTRAMUSCULAR | Status: DC | PRN
  Administered 2023-06-26: 3 mL

## 2023-06-26 MED ORDER — FENTANYL CITRATE (PF) 100 MCG/2ML IJ SOLN
INTRAMUSCULAR | Status: AC
  Filled 2023-06-26: qty 2

## 2023-06-26 MED ORDER — ONDANSETRON HCL 4 MG/2ML IJ SOLN
4.0000 mg | Freq: Once | INTRAMUSCULAR | Status: AC
  Administered 2023-06-26: 4 mg via INTRAVENOUS

## 2023-06-26 MED ORDER — EPINEPHRINE 1 MG/10ML IJ SOSY
PREFILLED_SYRINGE | INTRAMUSCULAR | Status: AC
  Filled 2023-06-26: qty 20

## 2023-06-26 MED ORDER — LABETALOL HCL 5 MG/ML IV SOLN
INTRAVENOUS | Status: DC | PRN
  Administered 2023-06-26 (×2): 2.5 mg via INTRAVENOUS
  Administered 2023-06-26: 5 mg via INTRAVENOUS

## 2023-06-26 SURGICAL SUPPLY — 25 items

## 2023-06-26 SURGICAL SUPPLY — 15 items

## 2023-06-26 NOTE — Progress Notes (Signed)
Pharmacy Antibiotic Note  Faith Castillo is a 47 y.o. female admitted on 06/25/2023 with anemia.  Patient is s/p EGD 9/10 concerning for esophageal candidiasis.  Pharmacy has been consulted for Fluconazole dosing.  Plan: Fluconazole 400mg  IV x 1 dose Then fluconazole 200mg  PO daily to complete 21 days of therapy.   Height: 5\' 6"  (167.6 cm) Weight: 63.8 kg (140 lb 10.5 oz) IBW/kg (Calculated) : 59.3  Temp (24hrs), Avg:98.2 F (36.8 C), Min:97.1 F (36.2 C), Max:99.2 F (37.3 C)  Recent Labs  Lab 06/22/23 1228 06/25/23 1259 06/26/23 0439  WBC 7.3 6.7 7.4  CREATININE 0.58 0.61 0.61    Estimated Creatinine Clearance: 81.4 mL/min (by C-G formula based on SCr of 0.61 mg/dL).    No Known Allergies   Thank you for allowing pharmacy to be a part of this patient's care.  Brandon Melnick 06/26/2023 12:57 PM

## 2023-06-26 NOTE — Plan of Care (Signed)
  Problem: Education: Goal: Knowledge of General Education information will improve Description: Including pain rating scale, medication(s)/side effects and non-pharmacologic comfort measures Outcome: Progressing   Problem: Health Behavior/Discharge Planning: Goal: Ability to manage health-related needs will improve Outcome: Progressing   Problem: Clinical Measurements: Goal: Diagnostic test results will improve Outcome: Progressing   Problem: Clinical Measurements: Goal: Respiratory complications will improve Outcome: Progressing   Problem: Clinical Measurements: Goal: Respiratory complications will improve Outcome: Progressing   Problem: Activity: Goal: Risk for activity intolerance will decrease Outcome: Progressing   Problem: Nutrition: Goal: Adequate nutrition will be maintained Outcome: Progressing   Problem: Coping: Goal: Level of anxiety will decrease Outcome: Progressing   Problem: Pain Managment: Goal: General experience of comfort will improve Outcome: Progressing   Problem: Safety: Goal: Ability to remain free from injury will improve Outcome: Progressing

## 2023-06-26 NOTE — Anesthesia Preprocedure Evaluation (Addendum)
Anesthesia Evaluation  Patient identified by MRN, date of birth, ID band Patient awake    Reviewed: Allergy & Precautions, NPO status , Patient's Chart, lab work & pertinent test results  Airway Mallampati: II  TM Distance: >3 FB Neck ROM: Full    Dental  (+) Poor Dentition, Chipped, Missing, Loose, Dental Advisory Given   Pulmonary neg pulmonary ROS   Pulmonary exam normal breath sounds clear to auscultation       Cardiovascular hypertension,  Rhythm:Regular Rate:Normal + Systolic murmurs    Neuro/Psych negative neurological ROS     GI/Hepatic negative GI ROS, Neg liver ROS,,,  Endo/Other  negative endocrine ROS    Renal/GU negative Renal ROS     Musculoskeletal negative musculoskeletal ROS (+)    Abdominal   Peds  Hematology  (+) Blood dyscrasia, anemia   Anesthesia Other Findings   Reproductive/Obstetrics                             Anesthesia Physical Anesthesia Plan  ASA: 2  Anesthesia Plan: MAC   Post-op Pain Management: Minimal or no pain anticipated   Induction: Intravenous  PONV Risk Score and Plan: 2 and TIVA, Treatment may vary due to age or medical condition and Propofol infusion  Airway Management Planned: Natural Airway  Additional Equipment:   Intra-op Plan:   Post-operative Plan:   Informed Consent: I have reviewed the patients History and Physical, chart, labs and discussed the procedure including the risks, benefits and alternatives for the proposed anesthesia with the patient or authorized representative who has indicated his/her understanding and acceptance.     Dental advisory given  Plan Discussed with: CRNA  Anesthesia Plan Comments:         Anesthesia Quick Evaluation

## 2023-06-26 NOTE — Progress Notes (Signed)
PROGRESS NOTE    Faith Castillo  EVO:[id number] DOB: [birth year] DOA: 06/25/2023 PCP: Oneita Hurt, No    Brief Narrative:   Faith Castillo is a 47 y.o. female with past medical history significant for HTN, chronic iron deficiency anemia in the setting of menorrhagia, weight loss, medical noncompliance with iron supplementation who presented to [facility] ED on 9/9 by direction of her GI physician for anemia with hemoglobin 6.3 and fatigue. Seen by GI outpatient on 9/6 for ongoing nausea, vomiting and 80 pound weight loss over 3-4 years.  Does report using Goody powder up to a couple months prior.  GI recommended to stay off Goody powder and prescribed oral iron sulfate, omeprazole, Zofran with plan for outpatient EGD/colonoscopy.  Given low hemoglobin of 6.3, patient was directed to the ED for further evaluation and management.  In the ED, temperature 99.2 F, HR 90, RR 16, BP 144/90, SpO2 100% on room air.  WBC 6.7, hemoglobin 5.8, platelet count 494.  Sodium 136, potassium 3.5, chloride 103, CO2 28, glucose 107, BUN 9, creatinine 0.61.  Iron 14, TIBC 442, ferritin 3, folate 9.2, vitamin B12 682.  GI was consulted.  Patient was transfused 2 unit PRBCs.  TRH consulted for admission for further evaluation and management of symptomatic anemia concerning for upper GI bleed.  Assessment & Plan:   Upper GI bleed secondary to duodenal ulcer and gastritis Patient presenting with progressive fatigue, weight loss was found to have a hemoglobin of 5.8.  GI was consulted and patient underwent EGD with findings of 2 partially obstructing oozing cratered duodenal ulcers with a visible vessel in the duodenal bulb, s/p coagulation for hemostasis which was unsuccessful and 1 hemostatic clip placed. -- [facility] gastroenterology following, appreciate assistance -- Hgb 5.8>8.3>9.6 -- Protonix drip x 72 hours -- Pathology: Pending -- Start clear liquid diet -- H/H q6h -- If develops recurrent bleeding, GI recommends consultation to  IR for intervention -- Discussed avoidance/abstinence of NSAIDs/Goody powder/BC powder.  Esophageal candidiasis Underwent EGD on 9/10 with findings of LA grade D Kandis sinus esophagitis with no bleeding. -- Fluconazole x 21 days  Essential hypertension -- Increase amlodipine to 10 mg p.o. daily  Iron deficiency anemia Anemia panel with iron 14, TIBC 442, ferritin 3, folate 9.2, vitamin B12 682. -- IV iron x 2 -- Ferrous sulfate 325 mg p.o. daily -- Follow hemoglobin as above  Menorrhagia Needs outpatient follow-up with GYN.   DVT prophylaxis: SCDs Start: 06/25/23 1626    Code Status: Full Code Family Communication: Updated patient's father present at bedside this afternoon  Disposition Plan:  Level of care: Telemetry Medical Status is: Inpatient Remains inpatient appropriate because: Protonix drip, needs further advancement of diet and ensure hemoglobin remained stable    Consultants:  [facility] [facility]  Procedures:  EGD 9/10  Antimicrobials:  Fluconazole   Subjective: Patient seen examined bedside, resting calmly.  States she feels "sleepy".  Just returned from PACU after she underwent endoscopy.  Discussed findings of esophageal candidiasis as well as gastric ulcers/gastritis.  Remains on Protonix drip.  No other specific questions or concerns at this time.  Patient denies headache, no dizziness, no chest pain, no palpitations, no shortness of breath, no fever/chills/night sweats, no nausea/vomiting/diarrhea, no focal weakness, no fatigue, no paresthesias.  No acute events overnight per nursing staff.  Objective: Vitals:   06/26/23 1215 06/26/23 1254 06/26/23 1339 06/26/23 1540  BP: (!) 181/107 (!) 181/105 (!) 164/93 (!) 162/99  Pulse: 77 77 83 88  Resp: 20  18    Temp: 97.6 F (36.4 C)     TempSrc:      SpO2: 100% 100%  100%  Weight:      Height:        Intake/Output Summary (Last 24 hours) at 06/26/2023 1718 Last data filed at 06/26/2023 1058 Gross  per 24 hour  Intake 1472.5 ml  Output --  Net 1472.5 ml   Filed Weights   06/25/23 1216 06/25/23 1901 06/26/23 0547  Weight: 61.7 kg 63.8 kg 63.8 kg    Examination:  Physical Exam: GEN: NAD, alert and oriented x 3, wd/wn HEENT: NCAT, PERRL, EOMI, sclera clear, MMM PULM: CTAB w/o wheezes/crackles, normal respiratory effort, on room air CV: RRR w/o M/G/R GI: abd soft, NTND, NABS, no R/G/M MSK: no peripheral edema, muscle strength globally intact 5/5 bilateral upper/lower extremities NEURO: CN II-XII intact, no focal deficits, sensation to light touch intact PSYCH: normal mood/affect Integumentary: dry/intact, no rashes or wounds    Data Reviewed: I have personally reviewed following labs and imaging studies  CBC: Recent Labs  Lab 06/22/23 1228 06/25/23 1259 06/26/23 0439 06/26/23 1356  WBC 7.3 6.7 7.4  --   NEUTROABS 4.3 R 3.9 4.7  --   HGB 6.3 Repeated and verified X2.* 5.8* 8.3* 9.6*  HCT 22.7 Repeated and verified X2.* 22.6* 29.0* 33.5*  MCV 62.4* 68.5* 68.9*  --   PLT 487.0* 494* 436*  --    Basic Metabolic Panel: Recent Labs  Lab 06/22/23 1228 06/25/23 1259 06/26/23 0439  NA 138 136 138  K 3.8 3.5 3.8  CL 105 103 108  CO2 28 28 22   GLUCOSE 119* 107* 89  BUN 10 9 5*  CREATININE 0.58 0.61 0.61  CALCIUM 9.0 8.5* 8.6*   GFR: Estimated Creatinine Clearance: 81.4 mL/min (by C-G formula based on SCr of 0.61 mg/dL). Liver Function Tests: Recent Labs  Lab 06/26/23 0439  AST 15  ALT 10  ALKPHOS 45  BILITOT 1.0  PROT 6.9  ALBUMIN 3.2*   No results for input(s): "LIPASE", "AMYLASE" in the last 168 hours. No results for input(s): "AMMONIA" in the last 168 hours. Coagulation Profile: No results for input(s): "INR", "PROTIME" in the last 168 hours. Cardiac Enzymes: No results for input(s): "CKTOTAL", "CKMB", "CKMBINDEX", "TROPONINI" in the last 168 hours. BNP (last 3 results) No results for input(s): "PROBNP" in the last 8760 hours. HbA1C: No results  for input(s): "HGBA1C" in the last 72 hours. CBG: No results for input(s): "GLUCAP" in the last 168 hours. Lipid Profile: No results for input(s): "CHOL", "HDL", "LDLCALC", "TRIG", "CHOLHDL", "LDLDIRECT" in the last 72 hours. Thyroid Function Tests: No results for input(s): "TSH", "T4TOTAL", "FREET4", "T3FREE", "THYROIDAB" in the last 72 hours. Anemia Panel: Recent Labs    06/25/23 1655  VITAMINB12 682  FOLATE 19.2  FERRITIN 3*  TIBC 442  IRON 14*  RETICCTPCT 1.3   Sepsis Labs: No results for input(s): "PROCALCITON", "LATICACIDVEN" in the last 168 hours.  No results found for this or any previous visit (from the past 240 hour(s)).       Radiology Studies: US PELVIS (TRANSABDOMINAL ONLY)  Result Date: 06/25/2023 CLINICAL DATA:  Menorrhagia EXAM: TRANSABDOMINAL ULTRASOUND OF PELVIS TECHNIQUE: Transabdominal ultrasound examination of the pelvis was performed including evaluation of the uterus, ovaries, adnexal regions, and pelvic cul-de-sac. COMPARISON:  CT abdomen and pelvis 06/05/2022 FINDINGS: Uterus Measurements: 10.4 x 6.0 x 6.1 cm = volume: 199 mL. No fibroids or other mass visualized. Endometrium Thickness: 5.5 mm.  No focal abnormality visualized. Right ovary Not seen. Left ovary Not seen. Other findings:  No abnormal free fluid. IMPRESSION: 1. The uterus and endometrium are unremarkable. 2. The ovaries are not visualized. 3. No free fluid in the pelvis. 4. Endometrial thickness is 5.5 mm. If bleeding remains unresponsive to hormonal or medical therapy, sonohysterogram should be considered for focal lesion work-up. (Ref: Radiological Reasoning: Algorithmic Workup of Abnormal Vaginal Bleeding with Endovaginal Sonography and Sonohysterography. AJR 2008; 161:W96-04) Electronically Signed   By: Darliss Cheney M.D.   On: 06/25/2023 22:58        Scheduled Meds:  sodium chloride   Intravenous Once   amLODipine  5 mg Oral Daily   ferrous sulfate  325 mg Oral Daily   [START ON  06/27/2023] fluconazole  200 mg Oral Daily   pantoprazole (PROTONIX) IV  40 mg Intravenous Q12H   vitamin B-12  100 mcg Oral Daily   Continuous Infusions:  sodium chloride 75 mL/hr at 06/26/23 1336   ferric gluconate (FERRLECIT) IVPB 250 mg (06/26/23 1655)   lactated ringers 10 mL/hr at 06/26/23 1122     LOS: 0 days    Time spent: 53 minutes spent on chart review, discussion with nursing staff, consultants, updating family and interview/physical exam; more than 50% of that time was spent in counseling and/or coordination of care.    Alvira Philips [place], DO [facility] Available via Epic secure chat 7am-7pm After these hours, please refer to coverage provider listed on [email or web address] 06/26/2023, 5:18 PM

## 2023-06-26 NOTE — Progress Notes (Signed)
Transition of Care [facility]) - Inpatient Brief Assessment   Patient Details  Name: Faith Castillo MRN: [id number] Date of Birth: 1976/02/13  Transition of Care [facility]) CM/SW Contact:    Janae Bridgeman, RN Phone Number: 06/26/2023, 4:14 PM   Clinical Narrative: Patient admitted for anemia - S/P colonoscopy.  No TOC needs at this time.  Follow up placed for [facility] to [facility] appointment if she does not already have a PCP - none listed.   Transition of Care Asessment: Insurance and Status: (P) Insurance coverage has been reviewed Patient has primary care physician: (P) No ([facility] provider placed in the AVS for patient to schedule) Home environment has been reviewed: (P) From home Prior level of function:: (P) Independent Prior/Current Home Services: (P) No current home services Social Determinants of Health Reivew: (P) SDOH reviewed interventions complete Readmission risk has been reviewed: (P) Yes Transition of care needs: (P) no transition of care needs at this time

## 2023-06-26 NOTE — Progress Notes (Signed)
Patient transferred from PACU agter EGD via bed. Alert and Oriented. Patient is on Room air. Upon transferred patient vomitted blood stained.  Will continue to monitor.

## 2023-06-26 NOTE — Transfer of Care (Signed)
Immediate Anesthesia Transfer of Care Note  Patient: Faith Castillo  Procedure(s) Performed: ESOPHAGOGASTRODUODENOSCOPY (EGD) WITH PROPOFOL  Patient Location: PACU  Anesthesia Type:General  Level of Consciousness: awake  Airway & Oxygen Therapy: Patient Spontanous Breathing  Post-op Assessment: Report given to RN and Post -op Vital signs reviewed and stable  Post vital signs: Reviewed and stable  Last Vitals:  Vitals Value Taken Time  BP    Temp    Pulse    Resp    SpO2      Last Pain:  Vitals:   06/26/23 1110  TempSrc:   PainSc: 0-No pain         Complications: No notable events documented.

## 2023-06-26 NOTE — Op Note (Addendum)
[facility] Patient Name: Faith Castillo Procedure Date : 06/26/2023 MRN: [id number] Attending MD: Napoleon Form , MD, [id number] Date of Birth: June 04, 1976 CSN: [id number] Age: 47 Admit Type: Inpatient Procedure:                Upper GI endoscopy Indications:              Suspected upper gastrointestinal bleeding,                            Suspected upper gastrointestinal bleeding in                            patient with unexplained iron deficiency anemia Providers:                Napoleon Form, MD, Roselie Awkward, RN, Harrington Challenger, Technician Referring MD:              Medicines:                Monitored Anesthesia Care Complications:            No immediate complications. Estimated Blood Loss:     ~20cc Procedure:                Pre-Anesthesia Assessment:                           - Prior to the procedure, a History and Physical                            was performed, and patient medications and                            allergies were reviewed. The patient's tolerance of                            previous anesthesia was also reviewed. The risks                            and benefits of the procedure and the sedation                            options and risks were discussed with the patient.                            All questions were answered, and informed consent                            was obtained. Prior Anticoagulants: The patient has                            taken no anticoagulant or antiplatelet agents. ASA                            Grade Assessment:  III - A patient with severe                            systemic disease. After reviewing the risks and                            benefits, the patient was deemed in satisfactory                            condition to undergo the procedure.                           After obtaining informed consent, the endoscope was                            passed under direct  vision. Throughout the                            procedure, the patient's blood pressure, pulse, and                            oxygen saturations were monitored continuously. The                            [id number]-[id number] ([id number]) Olympus endoscope was introduced                            through the mouth, and advanced to the second part                            of duodenum. The upper GI endoscopy was                            accomplished without difficulty. The patient                            tolerated the procedure well. Scope In: Scope Out: Findings:      Patchy, yellow plaques were found in the entire esophagus. Biopsies were       taken with a cold forceps for histology.      Patchy mild inflammation characterized by congestion (edema) and       erythema was found in the entire examined stomach. Biopsies were taken       with a cold forceps for Helicobacter pylori testing.      Two partially obstructing oozing cratered duodenal ulcers with a visible       vessel were found in the duodenal bulb. The largest lesion was 12 mm in       largest dimension. Coagulation for hemostasis using bipolar probe was       unsuccessful. Area was successfully injected with 3 mL of a 0.1 mg/mL       solution of epinephrine for hemostasis. For hemostasis, one hemostatic       clip was successfully placed. Clip manufacturer: [employer].       Bleeding had stopped at the end of the procedure. To treat the bleeding  lesion, Purestat spray was deployed. A single spray was applied. There       was no bleeding at the end of the procedure.      The second portion of the duodenum was normal. Impression:               - Esophageal plaques were found, consistent with                            candidiasis. Biopsied.                           - Gastritis. Biopsied.                           - Partially obstructing oozing duodenal ulcers with                            a visible vessel. NSAID  induced etiology. Treatment                            not successful. Treated with bipolar cautery.                            Injected. Clip was placed. Clip manufacturer:                            [employer]. hemostatic spray applied.                           - Normal second portion of the duodenum. Recommendation:           - NPO now and advance to clear liquids later this                            evening if hgb remains stable with no further                            bleeding                           -Repeat H&H q8h                           - PPI gtt 72 hours                           - Consult IR if develops recurrent bleed                           - Await pathololy results                           - Will defer colonoscopy given duodenal ulcer with                            active GI hemorrhage is the etiology of GI blood  loss                           - Diflucan 100mg  daily X 21 days Procedure Code(s):        --- Professional ---                           43255, 59, Esophagogastroduodenoscopy, flexible,                            transoral; with control of bleeding, any method                           43239, Esophagogastroduodenoscopy, flexible,                            transoral; with biopsy, single or multiple Diagnosis Code(s):        --- Professional ---                           K22.9, Disease of esophagus, unspecified                           K29.70, Gastritis, unspecified, without bleeding                           T39.395S, Adverse effect of other nonsteroidal                            anti-inflammatory drugs [NSAID], sequela                           K26.4, Chronic or unspecified duodenal ulcer with                            hemorrhage                           D50.9, Iron deficiency anemia, unspecified CPT copyright 2022 American Medical Association. All rights reserved. The codes documented in this report are  preliminary and upon coder review may  be revised to meet current compliance requirements. Napoleon Form, MD 06/26/2023 11:23:58 AM This report has been signed electronically. Number of Addenda: 0

## 2023-06-26 NOTE — Transfer of Care (Signed)
Immediate Anesthesia Transfer of Care Note  Patient: Faith Castillo  Procedure(s) Performed: ESOPHAGOGASTRODUODENOSCOPY (EGD) WITH PROPOFOL HOT HEMOSTASIS (ARGON PLASMA COAGULATION/BICAP) HEMOSTASIS CONTROL SCLEROTHERAPY HEMOSTASIS CLIP PLACEMENT BIOPSY  Patient Location: PACU and Endoscopy Unit  Anesthesia Type:MAC  Level of Consciousness: awake  Airway & Oxygen Therapy: Patient Spontanous Breathing  Post-op Assessment: Report given to RN and Post -op Vital signs reviewed and stable  Post vital signs: Reviewed and stable  Last Vitals:  Vitals Value Taken Time  BP 166/104 06/26/23 1103  Temp    Pulse 86 06/26/23 1106  Resp 22 06/26/23 1106  SpO2 100 % 06/26/23 1106  Vitals shown include unfiled device data.  Last Pain:  Vitals:   06/26/23 0840  TempSrc: Temporal  PainSc: 0-No pain         Complications: No notable events documented.

## 2023-06-26 NOTE — Anesthesia Preprocedure Evaluation (Signed)
Anesthesia Evaluation  Patient identified by MRN, date of birth, ID band Patient awake    Reviewed: Allergy & Precautions, NPO status , Patient's Chart, lab work & pertinent test results  Airway Mallampati: II  TM Distance: >3 FB Neck ROM: Full    Dental  (+) Poor Dentition, Chipped, Missing, Loose, Dental Advisory Given   Pulmonary neg pulmonary ROS   Pulmonary exam normal breath sounds clear to auscultation       Cardiovascular hypertension, Pt. on medications  Rhythm:Regular Rate:Normal + Systolic murmurs    Neuro/Psych negative neurological ROS     GI/Hepatic negative GI ROS, Neg liver ROS,,,  Endo/Other  negative endocrine ROS    Renal/GU negative Renal ROS     Musculoskeletal negative musculoskeletal ROS (+)    Abdominal   Peds  Hematology  (+) Blood dyscrasia, anemia   Anesthesia Other Findings   Reproductive/Obstetrics                              Anesthesia Physical Anesthesia Plan  ASA: 2 and emergent  Anesthesia Plan: General   Post-op Pain Management: Minimal or no pain anticipated   Induction: Intravenous  PONV Risk Score and Plan: 2 and Treatment may vary due to age or medical condition, Ondansetron and Dexamethasone  Airway Management Planned: Oral ETT  Additional Equipment:   Intra-op Plan:   Post-operative Plan: Extubation in OR  Informed Consent: I have reviewed the patients History and Physical, chart, labs and discussed the procedure including the risks, benefits and alternatives for the proposed anesthesia with the patient or authorized representative who has indicated his/her understanding and acceptance.     Dental advisory given  Plan Discussed with: CRNA  Anesthesia Plan Comments:          Anesthesia Quick Evaluation

## 2023-06-26 NOTE — Anesthesia Procedure Notes (Signed)
Procedure Name: Intubation Date/Time: 06/26/2023 11:26 AM  Performed by: Alwyn Ren, CRNAPre-anesthesia Checklist: Patient identified, Emergency Drugs available, Suction available and Patient being monitored Patient Re-evaluated:Patient Re-evaluated prior to induction Oxygen Delivery Method: Circle system utilized Preoxygenation: Pre-oxygenation with 100% oxygen Induction Type: IV induction and Rapid sequence Ventilation: Mask ventilation without difficulty Laryngoscope Size: Miller and 2 Grade View: Grade I Tube type: Oral Tube size: 7.0 mm Number of attempts: 1 Airway Equipment and Method: Stylet and Oral airway Placement Confirmation: ETT inserted through vocal cords under direct vision, positive ETCO2 and breath sounds checked- equal and bilateral Secured at: 22 cm Tube secured with: Tape Dental Injury: Teeth and Oropharynx as per pre-operative assessment

## 2023-06-26 NOTE — Progress Notes (Signed)
Post EGD, in the recovery patient started vomiting blood.  Vitals were stable Plan to repeat EGD to exclude ongoing bleed with duodenal ulcer with visible vessel and active GI hemorrhage Discussed plan with family (father) at bedside regarding repeat EGD, intubation to protect airway He agree to proceed  Iona Beard , MD [phone]

## 2023-06-26 NOTE — Op Note (Signed)
[facility] Patient Name: Faith Castillo Procedure Date : 06/26/2023 MRN: [id number] Attending MD: Napoleon Form , MD, [id number] Date of Birth: [birth year] CSN: [id number] Age: 47 Admit Type: Inpatient Procedure:                Upper GI endoscopy Indications:              Active gastrointestinal bleeding, vomiting blood in                            the recover s/p EGD Providers:                Napoleon Form, MD, Roselie Awkward, RN, Harrington Challenger, Technician Referring MD:              Medicines:                General Anesthesia Complications:            No immediate complications. Estimated Blood Loss:     Estimated blood loss: none. Procedure:                Pre-Anesthesia Assessment:                           - Prior to the procedure, a History and Physical                            was performed, and patient medications and                            allergies were reviewed. The patient's tolerance of                            previous anesthesia was also reviewed. The risks                            and benefits of the procedure and the sedation                            options and risks were discussed with the patient.                            All questions were answered, and informed consent                            was obtained. Prior Anticoagulants: The patient has                            taken no anticoagulant or antiplatelet agents. ASA                            Grade Assessment: III - A patient with severe  systemic disease. After reviewing the risks and                            benefits, the patient was deemed in satisfactory                            condition to undergo the procedure.                           After obtaining informed consent, the endoscope was                            passed under direct vision. Throughout the                            procedure, the patient's  blood pressure, pulse, and                            oxygen saturations were monitored continuously. The                            [id number]-[id number] ([id number]) Olympus endoscope was introduced                            through the mouth, and advanced to the pylorus. The                            upper GI endoscopy was accomplished without                            difficulty. The patient tolerated the procedure                            well. Scope In: Scope Out: Findings:      LA Grade D (one or more mucosal breaks involving at least 75% of       esophageal circumference) esophagitis with no bleeding was found 25 to       40 cm from the incisors. Heme noted at site of biopsies in the esophagus       but no active bleeding in the esophagus, stomach or duodenal buld. Scope       was not extended to 2nd part of duodenum due to recently treated       duodenal ulcer with active hemorrhage Impression:               - LA Grade D candidiasis esophagitis with no                            bleeding.                           - No specimens collected. Recommendation:           - See the other procedure note for documentation of  additional recommendations. Procedure Code(s):        --- Professional ---                           [id number], 53, Esophagogastroduodenoscopy, flexible,                            transoral; diagnostic, including collection of                            specimen(s) by brushing or washing, when performed                            (separate procedure) Diagnosis Code(s):        --- Professional ---                           B37.81, Candidal esophagitis                           K92.2, Gastrointestinal hemorrhage, unspecified CPT copyright 2022 American Medical Association. All rights reserved. The codes documented in this report are preliminary and upon coder review may  be revised to meet current compliance requirements. Napoleon Form,  MD 06/26/2023 11:47:17 AM This report has been signed electronically. Number of Addenda: 0

## 2023-06-26 NOTE — Progress Notes (Signed)
Pt had a timed lab for 0130, lab didn't to collect it. Changed to STAT at 0300 am. Its been and lab haven't collect it. RN called multiple times with no response.

## 2023-06-26 NOTE — Progress Notes (Signed)
Patient was taken for the procedure at 0800 AM

## 2023-06-26 NOTE — Plan of Care (Signed)

## 2023-06-26 NOTE — Interval H&P Note (Signed)
History and Physical Interval Note:  06/26/2023 10:12 AM  Faith Castillo  has presented today for surgery, with the diagnosis of iron deficiency anemia.  The various methods of treatment have been discussed with the patient and family. After consideration of risks, benefits and other options for treatment, the patient has consented to  Procedure(s): COLONOSCOPY WITH PROPOFOL (N/A) ESOPHAGOGASTRODUODENOSCOPY (EGD) WITH PROPOFOL (N/A) as a surgical intervention.  The patient's history has been reviewed, patient examined, no change in status, stable for surgery.  I have reviewed the patient's chart and labs.  Questions were answered to the patient's satisfaction.     Dellia Donnelly

## 2023-06-26 NOTE — Anesthesia Postprocedure Evaluation (Signed)
Anesthesia Post Note  Patient: Faith Castillo  Procedure(s) Performed: ESOPHAGOGASTRODUODENOSCOPY (EGD) WITH PROPOFOL HOT HEMOSTASIS (ARGON PLASMA COAGULATION/BICAP) HEMOSTASIS CONTROL SCLEROTHERAPY HEMOSTASIS CLIP PLACEMENT BIOPSY     Patient location during evaluation: PACU Anesthesia Type: MAC Level of consciousness: awake and alert Pain management: pain level controlled Vital Signs Assessment: post-procedure vital signs reviewed and stable Respiratory status: spontaneous breathing Cardiovascular status: stable Anesthetic complications: no   No notable events documented.  Last Vitals:  Vitals:   06/26/23 1339 06/26/23 1540  BP: (!) 164/93 (!) 162/99  Pulse: 83 88  Resp:    Temp:    SpO2:  100%    Last Pain:  Vitals:   06/26/23 1215  TempSrc:   PainSc: 0-No pain                 Lewie Loron

## 2023-06-27 ENCOUNTER — Other Ambulatory Visit: Payer: Self-pay

## 2023-06-27 DIAGNOSIS — K921 Melena: Secondary | ICD-10-CM | POA: Diagnosis not present

## 2023-06-27 DIAGNOSIS — K269 Duodenal ulcer, unspecified as acute or chronic, without hemorrhage or perforation: Secondary | ICD-10-CM

## 2023-06-27 DIAGNOSIS — K264 Chronic or unspecified duodenal ulcer with hemorrhage: Principal | ICD-10-CM

## 2023-06-27 DIAGNOSIS — D649 Anemia, unspecified: Secondary | ICD-10-CM | POA: Diagnosis not present

## 2023-06-27 LAB — CBC WITH DIFFERENTIAL/PLATELET
Abs Immature Granulocytes: 0.04 10*3/uL (ref 0.00–0.07)
Basophils Absolute: 0 10*3/uL (ref 0.0–0.1)
Basophils Relative: 0 %
Eosinophils Absolute: 0.1 10*3/uL (ref 0.0–0.5)
Eosinophils Relative: 1 %
HCT: 29.4 % — ABNORMAL LOW (ref 36.0–46.0)
Hemoglobin: 8.2 g/dL — ABNORMAL LOW (ref 12.0–15.0)
Immature Granulocytes: 0 %
Lymphocytes Relative: 16 %
Lymphs Abs: 1.6 10*3/uL (ref 0.7–4.0)
MCH: 19.5 pg — ABNORMAL LOW (ref 26.0–34.0)
MCHC: 27.9 g/dL — ABNORMAL LOW (ref 30.0–36.0)
MCV: 69.8 fL — ABNORMAL LOW (ref 80.0–100.0)
Monocytes Absolute: 0.8 10*3/uL (ref 0.1–1.0)
Monocytes Relative: 8 %
Neutro Abs: 7.5 10*3/uL (ref 1.7–7.7)
Neutrophils Relative %: 75 %
Platelets: 457 10*3/uL — ABNORMAL HIGH (ref 150–400)
RBC: 4.21 MIL/uL (ref 3.87–5.11)
RDW: 20.9 % — ABNORMAL HIGH (ref 11.5–15.5)
WBC: 10.1 10*3/uL (ref 4.0–10.5)
nRBC: 0.2 % (ref 0.0–0.2)

## 2023-06-27 LAB — HEMOGLOBIN AND HEMATOCRIT, BLOOD
HCT: 29.1 % — ABNORMAL LOW (ref 36.0–46.0)
HCT: 28.1 % — ABNORMAL LOW (ref 36.0–46.0)
Hemoglobin: 8.4 g/dL — ABNORMAL LOW (ref 12.0–15.0)
Hemoglobin: 7.9 g/dL — ABNORMAL LOW (ref 12.0–15.0)

## 2023-06-27 MED ORDER — SUCRALFATE 1 GM/10ML PO SUSP
1.0000 g | Freq: Three times a day (TID) | ORAL | Status: DC
  Administered 2023-06-27 – 2023-06-28 (×4): 1 g via ORAL
  Filled 2023-06-27 (×5): qty 10

## 2023-06-27 MED ORDER — FERROUS SULFATE 325 (65 FE) MG PO TABS
325.0000 mg | ORAL_TABLET | Freq: Every day | ORAL | Status: DC
  Administered 2023-06-28: 325 mg via ORAL
  Filled 2023-06-27: qty 1

## 2023-06-27 NOTE — Anesthesia Postprocedure Evaluation (Signed)
Anesthesia Post Note  Patient: Faith Castillo  Procedure(s) Performed: ESOPHAGOGASTRODUODENOSCOPY (EGD) WITH PROPOFOL     Patient location during evaluation: PACU Anesthesia Type: General Level of consciousness: sedated and patient cooperative Pain management: pain level controlled Vital Signs Assessment: post-procedure vital signs reviewed and stable Respiratory status: spontaneous breathing Cardiovascular status: stable Anesthetic complications: no   No notable events documented.  Last Vitals:  Vitals:   06/27/23 0423 06/27/23 0717  BP: 125/71 116/71  Pulse: 79 75  Resp: 18 17  Temp: 37.1 C 36.7 C  SpO2: 99% 99%    Last Pain:  Vitals:   06/27/23 0717  TempSrc: Oral  PainSc:                  Lewie Loron

## 2023-06-27 NOTE — Plan of Care (Signed)
Problem: Education: Goal: Knowledge of General Education information will improve Description Including pain rating scale, medication(s)/side effects and non-pharmacologic comfort measures Outcome: Progressing   Problem: Health Behavior/Discharge Planning: Goal: Ability to manage health-related needs will improve Outcome: Progressing   Problem: Coping: Goal: Level of anxiety will decrease Outcome: Progressing   Problem: Elimination: Goal: Will not experience complications related to urinary retention Outcome: Progressing   Problem: Pain Managment: Goal: General experience of comfort will improve Outcome: Progressing   Problem: Safety: Goal: Ability to remain free from injury will improve Outcome: Progressing   Problem: Skin Integrity: Goal: Risk for impaired skin integrity will decrease Outcome: Progressing

## 2023-06-27 NOTE — Progress Notes (Addendum)
Progress Note  Primary GI: Dr. Chales Abrahams DOA: 06/25/2023         Hospital Day: 3   Subjective  Chief Complaint: IDA  No family was present at the time of my evaluation. Endoscopy yesterday with Dr. Lavon Paganini Patient had no further bowel movements, had 1 episode of vomiting after procedure but no further nausea or vomiting.  Currently on clear liquid diet, states she is tolerating this well.  Hemoglobin stable.    Objective   Vital signs in last 24 hours: Temp:  [97.5 F (36.4 C)-98.8 F (37.1 C)] 98.1 F (36.7 C) (09/11 0717) Pulse Rate:  [72-89] 75 (09/11 0717) Resp:  [17-23] 17 (09/11 0717) BP: (109-181)/(71-109) 116/71 (09/11 0717) SpO2:  [99 %-100 %] 99 % (09/11 0717) Last BM Date : 06/26/23 Last BM recorded by nurses in past 5 days Stool Type: Type 6 (Mushy consistency with ragged edges) (06/26/2023  9:00 PM)  General:   Pleasant, well developed female in no acute distress Heart:  regular rate and rhythm, no murmurs or gallops Pulm: Clear anteriorly; no wheezing Abdomen:  Soft, Flat AB, Active bowel sounds. No tenderness, No organomegaly appreciated. Msk:  Symmetrical without gross deformities. Peripheral pulses intact.  Psychiatric:  Cooperative. Normal mood and affect.  Intake/Output from previous day: 09/10 0701 - 09/11 0700 In: 700 [I.V.:700] Out: -  Intake/Output this shift: No intake/output data recorded.  Studies/Results: US PELVIS (TRANSABDOMINAL ONLY)  Result Date: 06/25/2023 CLINICAL DATA:  Menorrhagia EXAM: TRANSABDOMINAL ULTRASOUND OF PELVIS TECHNIQUE: Transabdominal ultrasound examination of the pelvis was performed including evaluation of the uterus, ovaries, adnexal regions, and pelvic cul-de-sac. COMPARISON:  CT abdomen and pelvis 06/05/2022 FINDINGS: Uterus Measurements: 10.4 x 6.0 x 6.1 cm = volume: 199 mL. No fibroids or other mass visualized. Endometrium Thickness: 5.5 mm.  No focal abnormality visualized. Right ovary Not seen. Left ovary Not seen.  Other findings:  No abnormal free fluid. IMPRESSION: 1. The uterus and endometrium are unremarkable. 2. The ovaries are not visualized. 3. No free fluid in the pelvis. 4. Endometrial thickness is 5.5 mm. If bleeding remains unresponsive to hormonal or medical therapy, sonohysterogram should be considered for focal lesion work-up. (Ref: Radiological Reasoning: Algorithmic Workup of Abnormal Vaginal Bleeding with Endovaginal Sonography and Sonohysterography. AJR 2008; 409:W11-91) Electronically Signed   By: Darliss Cheney M.D.   On: 06/25/2023 22:58    Lab Results: Recent Labs    06/25/23 1259 06/26/23 0439 06/26/23 1356 06/26/23 2034 06/27/23 0815  WBC 6.7 7.4  --   --  10.1  HGB 5.8* 8.3* 9.6* 9.1* 8.2*  HCT 22.6* 29.0* 33.5* 31.4* 29.4*  PLT 494* 436*  --   --  457*   BMET Recent Labs    06/25/23 1259 06/26/23 0439  NA 136 138  K 3.5 3.8  CL 103 108  CO2 28 22  GLUCOSE 107* 89  BUN 9 5*  CREATININE 0.61 0.61  CALCIUM 8.5* 8.6*   LFT Recent Labs    06/26/23 0439  PROT 6.9  ALBUMIN 3.2*  AST 15  ALT 10  ALKPHOS 45  BILITOT 1.0   PT/INR No results for input(s): "LABPROT", "INR" in the last 72 hours.   Scheduled Meds:  sodium chloride   Intravenous Once   amLODipine  10 mg Oral Daily   [START ON 06/28/2023] ferrous sulfate  325 mg Oral Daily   fluconazole  200 mg Oral Daily   pantoprazole (PROTONIX) IV  40 mg Intravenous Q12H   vitamin B-12  100 mcg Oral Daily   Continuous Infusions:  sodium chloride     sodium chloride     sodium chloride 75 mL/hr at 06/26/23 1336   ferric gluconate (FERRLECIT) IVPB 250 mg (06/27/23 1059)   lactated ringers 10 mL/hr at 06/26/23 1122      Patient profile:   47 year old female history of symptomatic anemia weight loss, GERD, history of menorrhagia and NSAID use presents with symptomatic anemia   Impression/Plan:   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 Hemoccult 2022 thought secondary to menorrhagia , no overt GI bleeding, NSAID  use HGB 5.8 status post 2 units PRBC, increased to 9.6, this morning Hgb currently 8.2 06/22/2023 Iron 10 Ferritin 2.4 B12 688 status post 2 doses iron, started on oral iron EGD 9/10 showed esophageal plaques consistent candidiasis, LA grade D candidiasis, gastritis, partially obstructed oozing duodenal ulcers with visible vessel likely secondary to NSAIDs treated with bipolar cautery, injected as well as clipped normal second portion of the duodenum -Hemoglobin setting, no episodes of rebleeding Patient was n.p.o. after procedure currently on liquids this morning, if patient tolerates this well can increase to full liquids and advance diet tomorrow to soft diet. PPI drip 72 hours total, I do not see where this was started, will at least give infusion today in good part of tomorrow then can switch over to IV PPI oral Diflucan 200 mg daily for 21 days for candidiasis esophagitis If patient has rebleeding consult IR Need to follow-up path Scheduled for EGD colonoscopy November 8-will keep this appointment as colonoscopy was deferred in the setting of large duodenal ulcer  GERD with duodenal ulcer History of BC/Goody powder use Instructed to stop all NSAIDs Will need to be on outpatient PPI 40 mg twice daily for at least 2 months Can repeat EGD November 8 with colonoscopy outpatient for reevaluation   Menorrhagia Suggest follow-up outpatient with GYN   History of hypertension  Principal Problem:   Symptomatic anemia Active Problems:   Weight loss   Essential hypertension   Menorrhagia   Iron (Fe) deficiency anemia   UGIB (upper gastrointestinal bleed)    LOS: 1 day   Doree Albee  06/27/2023, 11:26 AM   Attending physician's note   I have taken a history, reviewed the chart and examined the patient. I performed a substantive portion of this encounter, including complete performance of at least one of the key components, in conjunction with the APP. I agree with the APP's note,  impression and recommendations.   No BM since yesterday.  No further vomiting Tolerating clear liquid diet Hemoglobin stable  Continue PPI IV 40 mg twice daily during hospitalization and then transition to Protonix 40 mg twice daily Carafate suspension 1 g before meals and at bedtime for 2 weeks Avoid NSAIDs Advance diet to full liquids and if hemoglobin remains stable can advance further to soft diet and advised patient to stay on soft diet for 1 week Follow-up pathology and H. pylori  Diflucan 100 mg p.o. suspension for esophageal candidiasis for 21 days   Patient was already scheduled for screening colonoscopy and EGD for iron deficiency anemia as outpatient in November, can keep that appointment to proceed with screening colonoscopy Follow-up CBC next week   Okay to discharge home tomorrow if hemoglobin remains stable with no further bleeding from GI standpoint  The patient was provided an opportunity to ask questions and all were answered. The patient agreed with the plan and demonstrated an understanding of the instructions.  Iona Beard , MD [phone]

## 2023-06-27 NOTE — Progress Notes (Signed)
PROGRESS NOTE    Faith Castillo  NWG:[id number] DOB: 09-11-1976 DOA: 06/25/2023 PCP: Oneita Hurt, No    Brief Narrative:   Faith Castillo is a 47 y.o. female with past medical history significant for HTN, chronic iron deficiency anemia in the setting of menorrhagia, weight loss, medical noncompliance with iron supplementation who presented to [facility] ED on 9/9 by direction of her GI physician for anemia with hemoglobin 6.3 and fatigue. Seen by GI outpatient on 9/6 for ongoing nausea, vomiting and 80 pound weight loss over 3-4 years.  Does report using Goody powder up to a couple months prior.  GI recommended to stay off Goody powder and prescribed oral iron sulfate, omeprazole, Zofran with plan for outpatient EGD/colonoscopy.  Given low hemoglobin of 6.3, patient was directed to the ED for further evaluation and management.  In the ED, temperature 99.2 F, HR 90, RR 16, BP 144/90, SpO2 100% on room air.  WBC 6.7, hemoglobin 5.8, platelet count 494.  Sodium 136, potassium 3.5, chloride 103, CO2 28, glucose 107, BUN 9, creatinine 0.61.  Iron 14, TIBC 442, ferritin 3, folate 9.2, vitamin B12 682.  GI was consulted.  Patient was transfused 2 unit PRBCs.  TRH consulted for admission for further evaluation and management of symptomatic anemia concerning for upper GI bleed.  Assessment & Plan:   Upper GI bleed secondary to duodenal ulcer and gastritis Patient presenting with progressive fatigue, weight loss was found to have a hemoglobin of 5.8.  GI was consulted and patient underwent EGD with findings of 2 partially obstructing oozing cratered duodenal ulcers with a visible vessel in the duodenal bulb, s/p coagulation for hemostasis which was unsuccessful and 1 hemostatic clip placed. -- [facility] [place] [facility] following, appreciate assistance -- Hgb 5.8>8.3>9.6> labs pending this morning -- Protonix 40mg  IV q12h -- Pathology: Pending -- H/H q6h -- If develops recurrent bleeding, GI recommends consultation to IR  for intervention -- Discussed avoidance/abstinence of NSAIDs/Goody powder/BC powder.  Esophageal candidiasis Underwent EGD on 9/10 with findings of LA grade esophageal candidiasis with no bleeding. -- Fluconazole x 21 days  Essential hypertension -- Amlodipine to 10 mg p.o. daily  Iron deficiency anemia Anemia panel with iron 14, TIBC 442, ferritin 3, folate 9.2, vitamin B12 682. -- IV iron x 2 -- Ferrous sulfate 325 mg p.o. daily -- Follow hemoglobin as above  Menorrhagia Needs outpatient follow-up with GYN.   DVT prophylaxis: SCDs Start: 06/25/23 1626    Code Status: Full Code Family Communication: No family present at bedside this morning  Disposition Plan:  Level of care: Telemetry Medical Status is: Inpatient Remains inpatient appropriate because: needs further advancement of diet and ensure hemoglobin remained stable; and GI signed off    Consultants:  [place] [facility]  Procedures:  EGD 9/10  Antimicrobials:  Fluconazole   Subjective: Patient seen examined bedside, resting calmly.  No specific complaints this morning.  Awaiting labs to be drawn this morning to ensure stability of hemoglobin. No other specific questions or concerns at this time.  Patient denies headache, no dizziness, no chest pain, no palpitations, no shortness of breath, no fever/chills/night sweats, no nausea/vomiting/diarrhea, no focal weakness, no fatigue, no paresthesias.  No acute events overnight per nursing staff.  Objective: Vitals:   06/26/23 1928 06/27/23 0027 06/27/23 0423 06/27/23 0717  BP: (!) 150/92 109/71 125/71 116/71  Pulse: 89 72 79 75  Resp: 18 18 18 17   Temp: 98.4 F (36.9 C) 98.3 F (36.8 C) 98.8 F (37.1 C) 98.1 F (  36.7 C)  TempSrc: Oral Oral Oral Oral  SpO2: 100% 100% 99% 99%  Weight:      Height:        Intake/Output Summary (Last 24 hours) at 06/27/2023 0920 Last data filed at 06/26/2023 1058 Gross per 24 hour  Intake 700 ml  Output --  Net 700  ml   Filed Weights   06/25/23 1216 06/25/23 1901 06/26/23 0547  Weight: 61.7 kg 63.8 kg 63.8 kg    Examination:  Physical Exam: GEN: NAD, alert and oriented x 3, wd/wn HEENT: NCAT, PERRL, EOMI, sclera clear, MMM PULM: CTAB w/o wheezes/crackles, normal respiratory effort, on room air CV: RRR w/o M/G/R GI: abd soft, NTND, NABS, no R/G/M MSK: no peripheral edema, muscle strength globally intact 5/5 bilateral upper/lower extremities NEURO: CN II-XII intact, no focal deficits, sensation to light touch intact PSYCH: normal mood/affect Integumentary: dry/intact, no rashes or wounds    Data Reviewed: I have personally reviewed following labs and imaging studies  CBC: Recent Labs  Lab 06/22/23 1228 06/25/23 1259 06/26/23 0439 06/26/23 1356 06/26/23 2034  WBC 7.3 6.7 7.4  --   --   NEUTROABS 4.3 R 3.9 4.7  --   --   HGB 6.3 Repeated and verified X2.* 5.8* 8.3* 9.6* 9.1*  HCT 22.7 Repeated and verified X2.* 22.6* 29.0* 33.5* 31.4*  MCV 62.4* 68.5* 68.9*  --   --   PLT 487.0* 494* 436*  --   --    Basic Metabolic Panel: Recent Labs  Lab 06/22/23 1228 06/25/23 1259 06/26/23 0439  NA 138 136 138  K 3.8 3.5 3.8  CL 105 103 108  CO2 28 28 22   GLUCOSE 119* 107* 89  BUN 10 9 5*  CREATININE 0.58 0.61 0.61  CALCIUM 9.0 8.5* 8.6*   GFR: Estimated Creatinine Clearance: 81.4 mL/min (by C-G formula based on SCr of 0.61 mg/dL). Liver Function Tests: Recent Labs  Lab 06/26/23 0439  AST 15  ALT 10  ALKPHOS 45  BILITOT 1.0  PROT 6.9  ALBUMIN 3.2*   No results for input(s): "LIPASE", "AMYLASE" in the last 168 hours. No results for input(s): "AMMONIA" in the last 168 hours. Coagulation Profile: No results for input(s): "INR", "PROTIME" in the last 168 hours. Cardiac Enzymes: No results for input(s): "CKTOTAL", "CKMB", "CKMBINDEX", "TROPONINI" in the last 168 hours. BNP (last 3 results) No results for input(s): "PROBNP" in the last 8760 hours. HbA1C: No results for  input(s): "HGBA1C" in the last 72 hours. CBG: No results for input(s): "GLUCAP" in the last 168 hours. Lipid Profile: No results for input(s): "CHOL", "HDL", "LDLCALC", "TRIG", "CHOLHDL", "LDLDIRECT" in the last 72 hours. Thyroid Function Tests: No results for input(s): "TSH", "T4TOTAL", "FREET4", "T3FREE", "THYROIDAB" in the last 72 hours. Anemia Panel: Recent Labs    06/25/23 1655  VITAMINB12 682  FOLATE 19.2  FERRITIN 3*  TIBC 442  IRON 14*  RETICCTPCT 1.3   Sepsis Labs: No results for input(s): "PROCALCITON", "LATICACIDVEN" in the last 168 hours.  No results found for this or any previous visit (from the past 240 hour(s)).       Radiology Studies: US PELVIS (TRANSABDOMINAL ONLY)  Result Date: 06/25/2023 CLINICAL DATA:  Menorrhagia EXAM: TRANSABDOMINAL ULTRASOUND OF PELVIS TECHNIQUE: Transabdominal ultrasound examination of the pelvis was performed including evaluation of the uterus, ovaries, adnexal regions, and pelvic cul-de-sac. COMPARISON:  CT abdomen and pelvis 06/05/2022 FINDINGS: Uterus Measurements: 10.4 x 6.0 x 6.1 cm = volume: 199 mL. No fibroids or other mass  visualized. Endometrium Thickness: 5.5 mm.  No focal abnormality visualized. Right ovary Not seen. Left ovary Not seen. Other findings:  No abnormal free fluid. IMPRESSION: 1. The uterus and endometrium are unremarkable. 2. The ovaries are not visualized. 3. No free fluid in the pelvis. 4. Endometrial thickness is 5.5 mm. If bleeding remains unresponsive to hormonal or medical therapy, sonohysterogram should be considered for focal lesion work-up. (Ref: Radiological Reasoning: Algorithmic Workup of Abnormal Vaginal Bleeding with Endovaginal Sonography and Sonohysterography. AJR 2008; 161:W96-04) Electronically Signed   By: Darliss Cheney M.D.   On: 06/25/2023 22:58        Scheduled Meds:  sodium chloride   Intravenous Once   amLODipine  10 mg Oral Daily   [START ON 06/28/2023] ferrous sulfate  325 mg Oral Daily    fluconazole  200 mg Oral Daily   pantoprazole (PROTONIX) IV  40 mg Intravenous Q12H   vitamin B-12  100 mcg Oral Daily   Continuous Infusions:  sodium chloride     sodium chloride     sodium chloride 75 mL/hr at 06/26/23 1336   ferric gluconate (FERRLECIT) IVPB 250 mg (06/26/23 1655)   lactated ringers 10 mL/hr at 06/26/23 1122     LOS: 1 day    Time spent: 51 minutes spent on chart review, discussion with nursing staff, consultants, updating family and interview/physical exam; more than 50% of that time was spent in counseling and/or coordination of care.    Alvira Philips [place], DO [facility] Available via Epic secure chat 7am-7pm After these hours, please refer to coverage provider listed on [email or web address] 06/27/2023, 9:20 AM

## 2023-06-27 NOTE — Plan of Care (Signed)

## 2023-06-28 ENCOUNTER — Other Ambulatory Visit (HOSPITAL_COMMUNITY): Payer: Self-pay

## 2023-06-28 ENCOUNTER — Telehealth (HOSPITAL_COMMUNITY): Payer: Self-pay

## 2023-06-28 DIAGNOSIS — B3781 Candidal esophagitis: Secondary | ICD-10-CM

## 2023-06-28 DIAGNOSIS — K269 Duodenal ulcer, unspecified as acute or chronic, without hemorrhage or perforation: Secondary | ICD-10-CM | POA: Diagnosis not present

## 2023-06-28 DIAGNOSIS — B9681 Helicobacter pylori [H. pylori] as the cause of diseases classified elsewhere: Secondary | ICD-10-CM

## 2023-06-28 DIAGNOSIS — D649 Anemia, unspecified: Secondary | ICD-10-CM | POA: Diagnosis not present

## 2023-06-28 LAB — CBC WITH DIFFERENTIAL/PLATELET
Abs Immature Granulocytes: 0.03 10*3/uL (ref 0.00–0.07)
Basophils Absolute: 0 10*3/uL (ref 0.0–0.1)
Basophils Relative: 0 %
Eosinophils Absolute: 0.2 10*3/uL (ref 0.0–0.5)
Eosinophils Relative: 2 %
HCT: 27 % — ABNORMAL LOW (ref 36.0–46.0)
Hemoglobin: 7.6 g/dL — ABNORMAL LOW (ref 12.0–15.0)
Immature Granulocytes: 0 %
Lymphocytes Relative: 17 %
Lymphs Abs: 1.6 10*3/uL (ref 0.7–4.0)
MCH: 19.6 pg — ABNORMAL LOW (ref 26.0–34.0)
MCHC: 28.1 g/dL — ABNORMAL LOW (ref 30.0–36.0)
MCV: 69.8 fL — ABNORMAL LOW (ref 80.0–100.0)
Monocytes Absolute: 1.1 10*3/uL — ABNORMAL HIGH (ref 0.1–1.0)
Monocytes Relative: 11 %
Neutro Abs: 6.7 10*3/uL (ref 1.7–7.7)
Neutrophils Relative %: 70 %
Platelets: 452 10*3/uL — ABNORMAL HIGH (ref 150–400)
RBC: 3.87 MIL/uL (ref 3.87–5.11)
RDW: 21.3 % — ABNORMAL HIGH (ref 11.5–15.5)
WBC: 9.7 10*3/uL (ref 4.0–10.5)
nRBC: 0 % (ref 0.0–0.2)

## 2023-06-28 LAB — HEMOGLOBIN AND HEMATOCRIT, BLOOD
HCT: 30.1 % — ABNORMAL LOW (ref 36.0–46.0)
Hemoglobin: 8.6 g/dL — ABNORMAL LOW (ref 12.0–15.0)

## 2023-06-28 LAB — SURGICAL PATHOLOGY

## 2023-06-28 MED ORDER — METRONIDAZOLE 500 MG PO TABS
500.0000 mg | ORAL_TABLET | Freq: Two times a day (BID) | ORAL | 0 refills | Status: AC
  Filled 2023-06-28: qty 28, 14d supply, fill #0

## 2023-06-28 MED ORDER — SODIUM CHLORIDE 0.9 % IV SOLN
250.0000 mg | Freq: Once | INTRAVENOUS | Status: AC
  Administered 2023-06-28: 250 mg via INTRAVENOUS
  Filled 2023-06-28: qty 20

## 2023-06-28 MED ORDER — AMLODIPINE BESYLATE 10 MG PO TABS: 10.0000 mg | ORAL_TABLET | Freq: Every day | ORAL | 0 refills | Status: AC

## 2023-06-28 MED ORDER — FERROUS SULFATE 325 (65 FE) MG PO TABS: 325.0000 mg | ORAL_TABLET | Freq: Every day | ORAL | 0 refills | Status: AC

## 2023-06-28 MED ORDER — SUCRALFATE 1 G PO TABS
1.0000 g | ORAL_TABLET | Freq: Four times a day (QID) | ORAL | 0 refills | Status: AC
Start: 2023-06-28 — End: 2023-07-12

## 2023-06-28 MED ORDER — PANTOPRAZOLE SODIUM 40 MG PO TBEC
40.0000 mg | DELAYED_RELEASE_TABLET | Freq: Two times a day (BID) | ORAL | 0 refills | Status: DC
Start: 2023-06-28 — End: 2023-08-24

## 2023-06-28 MED ORDER — DOXYCYCLINE HYCLATE 100 MG PO TABS
100.0000 mg | ORAL_TABLET | Freq: Two times a day (BID) | ORAL | 0 refills | Status: AC
  Filled 2023-06-28: qty 28, 14d supply, fill #0

## 2023-06-28 MED ORDER — IRON SUCROSE 500 MG IVPB - SIMPLE MED
500.0000 mg | Freq: Once | INTRAVENOUS | Status: DC
  Filled 2023-06-28: qty 275

## 2023-06-28 MED ORDER — FLUCONAZOLE 200 MG PO TABS: 200.0000 mg | ORAL_TABLET | Freq: Every day | ORAL | 0 refills | Status: AC

## 2023-06-28 MED ORDER — BISMUTH SUBSALICYLATE 262 MG PO CHEW
2.0000 | CHEWABLE_TABLET | Freq: Four times a day (QID) | ORAL | 0 refills | Status: AC
  Filled 2023-06-28: qty 120, 15d supply, fill #0

## 2023-06-28 MED ORDER — BISMUTH/METRONIDAZ/TETRACYCLIN 140-125-125 MG PO CAPS
3.0000 | ORAL_CAPSULE | Freq: Four times a day (QID) | ORAL | 0 refills | Status: DC
  Filled 2023-06-28: qty 120, 10d supply, fill #0

## 2023-06-28 NOTE — Discharge Summary (Addendum)
Physician Discharge Summary  Faith Castillo HYQ:[id number] DOB: 08/13/1976 DOA: 06/25/2023  PCP: Pcp, No  Admit date: 06/25/2023 Discharge date: 06/28/2023  Admitted From: Home Disposition: Home  Recommendations for Outpatient Follow-up:  Follow up with PCP in 1-2 weeks Follow-up with GI as scheduled in November for colonoscopy/EGD Recommend outpatient referral to GYN due to history of menorrhagia Started on Protonix 40 mg p.o. twice daily and Carafate for upper GI bleed secondary to gastric ulcer. Continue fluconazole to complete treatment course x 21 days for esophageal candidiasis Bismuth, metronidazole, doxycycline x 14 days for H. pylori infection Started on amlodipine for poorly controlled hypertension Please obtain CBC in one week to reassess hemoglobin level  Home Health: No Equipment/Devices: None  Discharge Condition: Stable CODE STATUS: Full code Diet recommendation: Heart healthy diet  History of present illness:  Faith Castillo is a 47 y.o. female with past medical history significant for HTN, chronic iron deficiency anemia in the setting of menorrhagia, weight loss, medical noncompliance with iron supplementation who presented to [facility] ED on 9/9 by direction of her GI physician for anemia with hemoglobin 6.3 and fatigue. Seen by GI outpatient on 9/6 for ongoing nausea, vomiting and 80 pound weight loss over 3-4 years.  Does report using Goody powder up to a couple months prior.  GI recommended to stay off Goody powder and prescribed oral iron sulfate, omeprazole, Zofran with plan for outpatient EGD/colonoscopy.  Given low hemoglobin of 6.3, patient was directed to the ED for further evaluation and management.   In the ED, temperature 99.2 F, HR 90, RR 16, BP 144/90, SpO2 100% on room air.  WBC 6.7, hemoglobin 5.8, platelet count 494.  Sodium 136, potassium 3.5, chloride 103, CO2 28, glucose 107, BUN 9, creatinine 0.61.  Iron 14, TIBC 442, ferritin 3, folate 9.2, vitamin B12 682.   GI was consulted.  Patient was transfused 2 unit PRBCs.  TRH consulted for admission for further evaluation and management of symptomatic anemia concerning for upper GI bleed.  Hospital course:  Upper GI bleed secondary to duodenal ulcer and gastritis +  Patient presenting with progressive fatigue, weight loss was found to have a hemoglobin of 5.8.  Patient was transfused 2 unit PRBCs.  GI was consulted and patient underwent EGD with findings of 2 partially obstructing oozing cratered duodenal ulcers with a visible vessel in the duodenal bulb, s/p coagulation for hemostasis which was unsuccessful and 1 hemostatic clip placed.  Continue Protonix 40 mg p.o. twice daily, Carafate 3 times daily AC/at bedtime x 2 weeks.  Pylera 3 capsules p.o. every 6 hours x 10 days for H. pylori infection.  Continue to encourage avoidance/absence of NSAIDs including Goody powder and BC powder as likely leading etiology to her GI bleed.  Recommend repeat CBC 1 week to reassess hemoglobin level.  Outpatient follow-up with GI as scheduled in November for repeat EGD and colonoscopy.   Esophageal candidiasis Underwent EGD on 9/10 with findings of LA grade esophageal candidiasis with no bleeding.  Continue fluconazole x 21 days   Essential hypertension Started on amlodipine 10 mg p.o. daily   Iron deficiency anemia Anemia panel with iron 14, TIBC 442, ferritin 3, folate 9.2, vitamin B12 682.  Treated with IV iron x 2.  Continue ferrous sulfate 325 mg p.o. daily.  Outpatient follow-up with PCP.  Recommend CBC 1 week.   Menorrhagia Needs outpatient follow-up with GYN.  Discharge Diagnoses:  Principal Problem:   Symptomatic anemia Active Problems:   Weight loss  Essential hypertension   Menorrhagia   Iron (Fe) deficiency anemia   UGIB (upper gastrointestinal bleed)   Duodenal ulcer   Melena   Gastrointestinal hemorrhage associated with duodenal ulcer    Discharge Instructions  Discharge Instructions      Call MD for:  difficulty breathing, headache or visual disturbances   Complete by: As directed    Call MD for:  extreme fatigue   Complete by: As directed    Call MD for:  persistant dizziness or light-headedness   Complete by: As directed    Call MD for:  persistant nausea and vomiting   Complete by: As directed    Call MD for:  severe uncontrolled pain   Complete by: As directed    Call MD for:  temperature >100.4   Complete by: As directed    Diet - low sodium heart healthy   Complete by: As directed    Increase activity slowly   Complete by: As directed       Allergies as of 06/28/2023   No Known Allergies      Medication List     STOP taking these medications    cephALEXin 500 MG capsule Commonly known as: KEFLEX   omeprazole 40 MG capsule Commonly known as: PRILOSEC   potassium chloride SA 20 MEQ tablet Commonly known as: KLOR-CON M   sucralfate 1 GM/10ML suspension Commonly known as: Carafate Replaced by: sucralfate 1 g tablet       TAKE these medications    amLODipine 10 MG tablet Commonly known as: NORVASC Take 1 tablet (10 mg total) by mouth daily. Start taking on: June 29, 2023   B-12 PO Take 1 tablet by mouth daily.   Bismuth Subsalicylate 262 MG Tabs Take 2 tablets (524 mg total) by mouth in the morning, at noon, in the evening, and at bedtime for 14 days.   doxycycline 100 MG tablet Commonly known as: VIBRA-TABS Take 1 tablet (100 mg total) by mouth 2 (two) times daily for 14 days.   ferrous sulfate 325 (65 FE) MG tablet Take 1 tablet (325 mg total) by mouth daily. Start taking on: June 29, 2023 What changed: additional instructions   fluconazole 200 MG tablet Commonly known as: DIFLUCAN Take 1 tablet (200 mg total) by mouth daily for 18 days. Start taking on: June 29, 2023   methocarbamol 500 MG tablet Commonly known as: ROBAXIN Take 1 tablet (500 mg total) by mouth at bedtime as needed for muscle spasms. What  changed: when to take this   metroNIDAZOLE 500 MG tablet Commonly known as: Flagyl Take 1 tablet (500 mg total) by mouth 2 (two) times daily for 14 days.   Multivitamin Adult Chew Chew 1 tablet by mouth daily.   Na Sulfate-K Sulfate-Mg Sulf 17.5-3.13-1.6 GM/177ML Soln Commonly known as: Suprep Bowel Prep Kit Take 1 kit by mouth as directed.   ondansetron 4 MG disintegrating tablet Commonly known as: ZOFRAN-ODT Use 1 tablet every 6 to 8 hours as needed for nausea and vomiting   pantoprazole 40 MG tablet Commonly known as: Protonix Take 1 tablet (40 mg total) by mouth 2 (two) times daily.   sucralfate 1 g tablet Commonly known as: Carafate Take 1 tablet (1 g total) by mouth 4 (four) times daily for 14 days. Replaces: sucralfate 1 GM/10ML suspension        Follow-up Information     [facility]. Schedule an appointment as soon as possible for a visit.  Specialty: Internal Medicine Why: Call the clinic and schedule a hospital follow up if you do not have a primary care provider. Contact information: [address] [place], [place] [address] [phone]        [facility]. Schedule an appointment as soon as possible for a visit.   Specialty: Gastroenterology Contact information: [address] [place] [address] [phone]               No Known Allergies  Consultations: [facility]   Procedures/Studies: US PELVIS (TRANSABDOMINAL ONLY)  Result Date: 06/25/2023 CLINICAL DATA:  Menorrhagia EXAM: TRANSABDOMINAL ULTRASOUND OF PELVIS TECHNIQUE: Transabdominal ultrasound examination of the pelvis was performed including evaluation of the uterus, ovaries, adnexal regions, and pelvic cul-de-sac. COMPARISON:  CT abdomen and pelvis 06/05/2022 FINDINGS: Uterus Measurements: 10.4 x 6.0 x 6.1 cm = volume: 199 mL. No fibroids or other mass visualized. Endometrium Thickness: 5.5 mm.   No focal abnormality visualized. Right ovary Not seen. Left ovary Not seen. Other findings:  No abnormal free fluid. IMPRESSION: 1. The uterus and endometrium are unremarkable. 2. The ovaries are not visualized. 3. No free fluid in the pelvis. 4. Endometrial thickness is 5.5 mm. If bleeding remains unresponsive to hormonal or medical therapy, sonohysterogram should be considered for focal lesion work-up. (Ref: Radiological Reasoning: Algorithmic Workup of Abnormal Vaginal Bleeding with Endovaginal Sonography and Sonohysterography. AJR 2008; 440:H47-42) Electronically Signed   By: Darliss Cheney M.D.   On: 06/25/2023 22:58     Subjective: Patient seen examined bedside, resting comfortably.  No specific complaints this morning.  Tolerating diet.  Ready for discharge home.  Discussed further avoidance of NSAIDs, which she seems to have understanding.  Also discussed treatment for her esophageal candidiasis as well as need to continue Protonix and Carafate.  Patient has already scheduled outpatient follow-up with GI in November for repeat EGD/colonoscopy.  No other specific questions or concerns at this time.  Denies headache, no dizziness, no chest pain, no palpitations, no shortness of breath, no abdominal pain, no fever/chills/night sweats, no nausea/vomiting/diarrhea, no focal weakness, no fatigue, no paresthesias.  No acute events overnight per nurse staff.  Discharge Exam: Vitals:   06/28/23 0430 06/28/23 0744  BP: (!) 110/57 107/62  Pulse: 66 78  Resp: 18 17  Temp: 98.1 F (36.7 C) 98.4 F (36.9 C)  SpO2: 99% 100%   Vitals:   06/27/23 2104 06/28/23 0051 06/28/23 0430 06/28/23 0744  BP: 109/61 107/68 (!) 110/57 107/62  Pulse: 70 66 66 78  Resp: 18 18 18 17   Temp: 97.9 F (36.6 C) 97.9 F (36.6 C) 98.1 F (36.7 C) 98.4 F (36.9 C)  TempSrc: Oral Oral Oral Oral  SpO2: 100% 100% 99% 100%  Weight:   62.8 kg   Height:        Physical Exam: GEN: NAD, alert and oriented x 3,  wd/wn HEENT: NCAT, PERRL, EOMI, sclera clear, MMM PULM: CTAB w/o wheezes/crackles, normal respiratory effort, on room air CV: RRR w/o M/G/R GI: abd soft, NTND, NABS, no R/G/M MSK: no peripheral edema, muscle strength globally intact 5/5 bilateral upper/lower extremities NEURO: CN II-XII intact, no focal deficits, sensation to light touch intact PSYCH: normal mood/affect Integumentary: dry/intact, no rashes or wounds    The results of significant diagnostics from this hospitalization (including imaging, microbiology, ancillary and laboratory) are listed below for reference.     Microbiology: No results found for this or any previous visit (from the past 240 hour(s)).  Labs: BNP (last 3 results) No results for input(s): "BNP" in the last 8760 hours. Basic Metabolic Panel: Recent Labs  Lab 06/22/23 1228 06/25/23 1259 06/26/23 0439  NA 138 136 138  K 3.8 3.5 3.8  CL 105 103 108  CO2 28 28 22   GLUCOSE 119* 107* 89  BUN 10 9 5*  CREATININE 0.58 0.61 0.61  CALCIUM 9.0 8.5* 8.6*   Liver Function Tests: Recent Labs  Lab 06/26/23 0439  AST 15  ALT 10  ALKPHOS 45  BILITOT 1.0  PROT 6.9  ALBUMIN 3.2*   No results for input(s): "LIPASE", "AMYLASE" in the last 168 hours. No results for input(s): "AMMONIA" in the last 168 hours. CBC: Recent Labs  Lab 06/22/23 1228 06/25/23 1259 06/26/23 0439 06/26/23 1356 06/27/23 0815 06/27/23 1511 06/27/23 2133 06/28/23 0420 06/28/23 1216  WBC 7.3 6.7 7.4  --  10.1  --   --  9.7  --   NEUTROABS 4.3 R 3.9 4.7  --  7.5  --   --  6.7  --   HGB 6.3 Repeated and verified X2.* 5.8* 8.3*   < > 8.2* 8.4* 7.9* 7.6* 8.6*  HCT 22.7 Repeated and verified X2.* 22.6* 29.0*   < > 29.4* 29.1* 28.1* 27.0* 30.1*  MCV 62.4* 68.5* 68.9*  --  69.8*  --   --  69.8*  --   PLT 487.0* 494* 436*  --  457*  --   --  452*  --    < > = values in this interval not displayed.   Cardiac Enzymes: No results for input(s): "CKTOTAL", "CKMB", "CKMBINDEX",  "TROPONINI" in the last 168 hours. BNP: Invalid input(s): "POCBNP" CBG: No results for input(s): "GLUCAP" in the last 168 hours. D-Dimer No results for input(s): "DDIMER" in the last 72 hours. Hgb A1c No results for input(s): "HGBA1C" in the last 72 hours. Lipid Profile No results for input(s): "CHOL", "HDL", "LDLCALC", "TRIG", "CHOLHDL", "LDLDIRECT" in the last 72 hours. Thyroid function studies No results for input(s): "TSH", "T4TOTAL", "T3FREE", "THYROIDAB" in the last 72 hours.  Invalid input(s): "FREET3" Anemia work up Recent Labs    06/25/23 1655  VITAMINB12 682  FOLATE 19.2  FERRITIN 3*  TIBC 442  IRON 14*  RETICCTPCT 1.3   Urinalysis    Component Value Date/Time   COLORURINE YELLOW 04/15/2023 1815   APPEARANCEUR HAZY (A) 04/15/2023 1815   LABSPEC 1.012 04/15/2023 1815   PHURINE 7.0 04/15/2023 1815   GLUCOSEU NEGATIVE 04/15/2023 1815   HGBUR NEGATIVE 04/15/2023 1815   BILIRUBINUR NEGATIVE 04/15/2023 1815   KETONESUR 20 (A) 04/15/2023 1815   PROTEINUR NEGATIVE 04/15/2023 1815   UROBILINOGEN 4.0 (H) 01/04/2021 1649   NITRITE POSITIVE (A) 04/15/2023 1815   LEUKOCYTESUR LARGE (A) 04/15/2023 1815   Sepsis Labs Recent Labs  Lab 06/25/23 1259 06/26/23 0439 06/27/23 0815 06/28/23 0420  WBC 6.7 7.4 10.1 9.7   Microbiology No results found for this or any previous visit (from the past 240 hour(s)).   Time coordinating discharge: Over 30 minutes  SIGNED:   Alvira Philips [place], DO  [facility] 06/28/2023, 3:00 PM

## 2023-06-28 NOTE — Progress Notes (Addendum)
Progress Note  Primary GI: Dr. Chales Abrahams DOA: 06/25/2023         Hospital Day: 4   Subjective  Chief Complaint: IDA  No family was present at the time of my evaluation. Endoscopy yesterday with Dr. Lavon Paganini Patient had no further bowel movements, had 1 episode of vomiting after procedure but no further nausea or vomiting.  Currently on clear liquid diet, states she is tolerating this well.  Hemoglobin stable.    Objective   Vital signs in last 24 hours: Temp:  [97.9 F (36.6 C)-99.2 F (37.3 C)] 98.4 F (36.9 C) (09/12 0744) Pulse Rate:  [66-78] 78 (09/12 0744) Resp:  [17-18] 17 (09/12 0744) BP: (106-119)/(57-71) 107/62 (09/12 0744) SpO2:  [99 %-100 %] 100 % (09/12 0744) Weight:  [62.8 kg] 62.8 kg (09/12 0430) Last BM Date : 06/27/23 Last BM recorded by nurses in past 5 days Stool Type: Type 5 (Soft blobs with clear-cut edges) (per pt) (06/28/2023  8:00 AM)  General:   Pleasant, well developed female in no acute distress Heart:  regular rate and rhythm, no murmurs or gallops Pulm: Clear anteriorly; no wheezing Abdomen:  Soft, Flat AB, Active bowel sounds. No tenderness, No organomegaly appreciated. Msk:  Symmetrical without gross deformities. Peripheral pulses intact.  Psychiatric:  Cooperative. Normal mood and affect.  Intake/Output from previous day: 09/11 0701 - 09/12 0700 In: 600 [P.O.:600] Out: -  Intake/Output this shift: Total I/O In: 100 [P.O.:100] Out: -   Studies/Results: No results found.  Lab Results: Recent Labs    06/26/23 0439 06/26/23 1356 06/27/23 0815 06/27/23 1511 06/27/23 2133 06/28/23 0420  WBC 7.4  --  10.1  --   --  9.7  HGB 8.3*   < > 8.2* 8.4* 7.9* 7.6*  HCT 29.0*   < > 29.4* 29.1* 28.1* 27.0*  PLT 436*  --  457*  --   --  452*   < > = values in this interval not displayed.   BMET Recent Labs    06/25/23 1259 06/26/23 0439  NA 136 138  K 3.5 3.8  CL 103 108  CO2 28 22  GLUCOSE 107* 89  BUN 9 5*  CREATININE 0.61 0.61   CALCIUM 8.5* 8.6*   LFT Recent Labs    06/26/23 0439  PROT 6.9  ALBUMIN 3.2*  AST 15  ALT 10  ALKPHOS 45  BILITOT 1.0   PT/INR No results for input(s): "LABPROT", "INR" in the last 72 hours.   Scheduled Meds:  sodium chloride   Intravenous Once   amLODipine  10 mg Oral Daily   ferrous sulfate  325 mg Oral Daily   fluconazole  200 mg Oral Daily   pantoprazole (PROTONIX) IV  40 mg Intravenous Q12H   sucralfate  1 g Oral TID WC & HS   vitamin B-12  100 mcg Oral Daily   Continuous Infusions:  sodium chloride     sodium chloride     ferric gluconate (FERRLECIT) IVPB 250 mg (06/28/23 1100)   lactated ringers 10 mL/hr at 06/26/23 1122      Patient profile:   47 year old female history of symptomatic anemia weight loss, GERD, history of menorrhagia and NSAID use presents with symptomatic anemia   Impression/Plan:   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 Hemoccult 2022 thought secondary to menorrhagia , no overt GI bleeding, NSAID use HGB 5.8 status post 2 units PRBC, increased to 9.6, this morning Hgb currently 8.2--Hgb 7.9, currently 7.6 06/22/2023 Iron 10 Ferritin  2.4 status post 2 doses iron, started on oral iron EGD 9/10 showed esophageal plaques consistent candidiasis, LA grade D candidiasis, gastritis, partially obstructed oozing duodenal ulcers with visible vessel likely secondary to NSAIDs treated with bipolar cautery, injected as well as clipped normal second portion of the duodenum HGB stable PPI 40 mg PO BID  Carafate 1g QID for 2 weeks Diflucan 200 mg daily for 21 days for candidiasis esophagitis Pathology positive for candidal esophagitis and H. pylori gastritis, may treat with Pylera outpatient, can check eradication at outpatient EGD.  Start pylera 1 week after discharge.   Scheduled for EGD colonoscopy November 8-will keep this appointment as colonoscopy was deferred in the setting of large duodenal ulcer Follow-up CBC next week  GERD with duodenal ulcer/Positive H  pylori History of BC/Goody powder use Instructed to stop all NSAIDs Positive H. pylori on pathology, suggest Pylera prescription outpatient or if too expensive below. Can start 1-2 weeks after discharge to avoid nausea.  -bismuth subsalicylate 262 mg 2 tabs 4 times daily (#112)  -metronidazole 500mg  BID (#28) -doxycyline 100mg  BID (#28) -pantoprazole 40 mg twice daily.     Menorrhagia Suggest follow-up outpatient with GYN   History of hypertension  Principal Problem:   Symptomatic anemia Active Problems:   Weight loss   Essential hypertension   Menorrhagia   Iron (Fe) deficiency anemia   UGIB (upper gastrointestinal bleed)   Duodenal ulcer   Melena   Gastrointestinal hemorrhage associated with duodenal ulcer    LOS: 2 days   Doree Albee  06/28/2023, 11:38 AM   Attending physician's note   I have taken a history, reviewed the chart and examined the patient. I performed a substantive portion of this encounter, including complete performance of at least one of the key components, in conjunction with the APP. I agree with the APP's note, impression and recommendations.   No further bleeding, hemoglobin stable PPI twice daily Complete 21-day course for Candida esophagitis  Start H. pylori treatment in 1 to 2 weeks and complete the course as prescribed  Follow-up in GI office for colonoscopy for colorectal cancer screening and to confirm H. pylori eradication   The patient was provided an opportunity to ask questions and all were answered. The patient agreed with the plan and demonstrated an understanding of the instructions.   Iona Beard , MD [phone]

## 2023-06-28 NOTE — Discharge Instructions (Signed)
Please avoid all NSAIDs including Goody powder, BC powder, ibuprofen, Aleve VTC

## 2023-06-28 NOTE — Telephone Encounter (Signed)
Pharmacy Patient Advocate Encounter  Received notification from [facility] that Prior Authorization for [facility] has been DENIED.  Full denial letter will be uploaded to the media tab. See denial reason below.   PA #/Case ID/Reference #: [id number] KEY: [id number]

## 2023-06-29 DIAGNOSIS — B9681 Helicobacter pylori [H. pylori] as the cause of diseases classified elsewhere: Secondary | ICD-10-CM

## 2023-06-29 DIAGNOSIS — K922 Gastrointestinal hemorrhage, unspecified: Secondary | ICD-10-CM

## 2023-06-29 DIAGNOSIS — B3781 Candidal esophagitis: Secondary | ICD-10-CM

## 2023-07-01 ENCOUNTER — Encounter (HOSPITAL_COMMUNITY): Payer: Self-pay | Admitting: Gastroenterology

## 2023-07-16 DIAGNOSIS — K2970 Gastritis, unspecified, without bleeding: Secondary | ICD-10-CM

## 2023-07-16 DIAGNOSIS — K922 Gastrointestinal hemorrhage, unspecified: Secondary | ICD-10-CM

## 2023-08-22 ENCOUNTER — Telehealth: Payer: Self-pay | Admitting: *Deleted

## 2023-08-22 MED ORDER — NA SULFATE-K SULFATE-MG SULF 17.5-3.13-1.6 GM/177ML PO SOLN: 1.0000 | Freq: Once | ORAL | 0 refills | Status: AC

## 2023-08-22 NOTE — Telephone Encounter (Signed)
Pt called stating the pharmacy no longer had her prep. New prescription sent to pharmacy.

## 2023-08-23 ENCOUNTER — Encounter: Payer: Self-pay | Admitting: Certified Registered Nurse Anesthetist

## 2023-08-24 ENCOUNTER — Encounter: Payer: Self-pay | Admitting: Internal Medicine

## 2023-08-24 ENCOUNTER — Encounter: Payer: BC Managed Care – PPO | Admitting: Gastroenterology

## 2023-08-24 ENCOUNTER — Ambulatory Visit: Payer: BC Managed Care – PPO | Admitting: Internal Medicine

## 2023-08-24 VITALS — BP 158/103 | HR 67 | Temp 97.9°F | Resp 15 | Ht 66.0 in | Wt 136.0 lb

## 2023-08-24 DIAGNOSIS — B9681 Helicobacter pylori [H. pylori] as the cause of diseases classified elsewhere: Secondary | ICD-10-CM | POA: Diagnosis not present

## 2023-08-24 DIAGNOSIS — D122 Benign neoplasm of ascending colon: Secondary | ICD-10-CM

## 2023-08-24 DIAGNOSIS — R112 Nausea with vomiting, unspecified: Secondary | ICD-10-CM

## 2023-08-24 DIAGNOSIS — K2980 Duodenitis without bleeding: Secondary | ICD-10-CM

## 2023-08-24 DIAGNOSIS — K2970 Gastritis, unspecified, without bleeding: Secondary | ICD-10-CM | POA: Diagnosis not present

## 2023-08-24 DIAGNOSIS — D509 Iron deficiency anemia, unspecified: Secondary | ICD-10-CM

## 2023-08-24 MED ORDER — PANTOPRAZOLE SODIUM 40 MG PO TBEC: 40.0000 mg | DELAYED_RELEASE_TABLET | Freq: Two times a day (BID) | ORAL | 0 refills | Status: DC

## 2023-08-24 MED ORDER — FLEET ENEMA RE ENEM
1.0000 | ENEMA | Freq: Once | RECTAL | Status: AC
Start: 2023-08-24 — End: 2023-08-24
  Administered 2023-08-24: 1 via RECTAL

## 2023-08-24 MED ORDER — SODIUM CHLORIDE 0.9 % IV SOLN: 500.0000 mL | Freq: Once | INTRAVENOUS | Status: DC

## 2023-08-24 NOTE — Progress Notes (Signed)
1333 BP 180/120, Labetalol given IV, MD update, vss

## 2023-08-24 NOTE — Progress Notes (Signed)
1325 BP  205/123, Labetalol given IV, MD update, vss

## 2023-08-24 NOTE — Progress Notes (Signed)
1320 Robinul 0.1 mg IV given due large amount of secretions upon assessment.  MD made aware, vss  ?

## 2023-08-24 NOTE — Op Note (Signed)
[facility] Patient Name: Faith Castillo Procedure Date: 08/24/2023 1:20 PM MRN: [id number] Endoscopist: Particia Lather , , [id number] Age: 47 Referring MD:  Date of Birth: 1976/07/18 Gender: Female Account #: [id number] Procedure:                Upper GI endoscopy Indications:              Iron deficiency anemia, Follow-up of Helicobacter                            pylori, Follow-up of duodenal ulcer Medicines:                Monitored Anesthesia Care Procedure:                Pre-Anesthesia Assessment:                           - Prior to the procedure, a History and Physical                            was performed, and patient medications and                            allergies were reviewed. The patient's tolerance of                            previous anesthesia was also reviewed. The risks                            and benefits of the procedure and the sedation                            options and risks were discussed with the patient.                            All questions were answered, and informed consent                            was obtained. Prior Anticoagulants: The patient has                            taken no anticoagulant or antiplatelet agents. ASA                            Grade Assessment: II - A patient with mild systemic                            disease. After reviewing the risks and benefits,                            the patient was deemed in satisfactory condition to                            undergo the procedure.  After obtaining informed consent, the endoscope was                            passed under direct vision. Throughout the                            procedure, the patient's blood pressure, pulse, and                            oxygen saturations were monitored continuously. The                            Olympus Scope SN [id number] was introduced through the                            mouth, and  advanced to the second part of duodenum.                            The upper GI endoscopy was accomplished without                            difficulty. The patient tolerated the procedure                            well. Scope In: Scope Out: Findings:                 The examined esophagus was normal.                           Localized inflammation characterized by congestion                            (edema), erosions, erythema and granularity was                            found in the gastric body and in the gastric                            antrum. Biopsies were taken with a cold forceps for                            histology.                           Localized inflammation characterized by congestion                            (edema), erythema and friability was found in the                            duodenal bulb. Complications:            No immediate complications. Estimated Blood Loss:     Estimated blood loss was minimal. Impression:               -  Normal esophagus.                           - Gastritis. Biopsied.                           - Duodenitis. Recommendation:           - Await pathology results.                           - Continue Protonix BID.                           - Perform a colonoscopy today. Dr Particia Lather "[name]" [place],  08/24/2023 2:06:59 PM

## 2023-08-24 NOTE — Patient Instructions (Signed)
Resume previous diet and medications. Awaiting pathology results Continue Protonix twice daily. Do not take Goodys powders, Aleve, Motrin or any other non-steroidal anti-inflammatory drugs. Take Tylenol as needed for fever or pain.  YOU HAD AN ENDOSCOPIC PROCEDURE TODAY AT THE  ENDOSCOPY CENTER:   Refer to the procedure report that was given to you for any specific questions about what was found during the examination.  If the procedure report does not answer your questions, please call your gastroenterologist to clarify.  If you requested that your care partner not be given the details of your procedure findings, then the procedure report has been included in a sealed envelope for you to review at your convenience later.  YOU SHOULD EXPECT: Some feelings of bloating in the abdomen. Passage of more gas than usual.  Walking can help get rid of the air that was put into your GI tract during the procedure and reduce the bloating. If you had a lower endoscopy (such as a colonoscopy or flexible sigmoidoscopy) you may notice spotting of blood in your stool or on the toilet paper. If you underwent a bowel prep for your procedure, you may not have a normal bowel movement for a few days.  Please Note:  You might notice some irritation and congestion in your nose or some drainage.  This is from the oxygen used during your procedure.  There is no need for concern and it should clear up in a day or so.  SYMPTOMS TO REPORT IMMEDIATELY:  Following lower endoscopy (colonoscopy or flexible sigmoidoscopy):  Excessive amounts of blood in the stool  Significant tenderness or worsening of abdominal pains  Swelling of the abdomen that is new, acute  Fever of 100F or higher  Following upper endoscopy (EGD)  Vomiting of blood or coffee ground material  New chest pain or pain under the shoulder blades  Painful or persistently difficult swallowing  New shortness of breath  Fever of 100F or higher  Black,  tarry-looking stools  For urgent or emergent issues, a gastroenterologist can be reached at any hour by calling [phone]. Do not use MyChart messaging for urgent concerns.    DIET:  We do recommend a small meal at first, but then you may proceed to your regular diet.  Drink plenty of fluids but you should avoid alcoholic beverages for 24 hours.  ACTIVITY:  You should plan to take it easy for the rest of today and you should NOT DRIVE or use heavy machinery until tomorrow (because of the sedation medicines used during the test).    FOLLOW UP: Our staff will call the number listed on your records the next business day following your procedure.  We will call around 7:15- 8:00 am to check on you and address any questions or concerns that you may have regarding the information given to you following your procedure. If we do not reach you, we will leave a message.     If any biopsies were taken you will be contacted by phone or by letter within the next 1-3 weeks.  Please call us at [phone] if you have not heard about the biopsies in 3 weeks.    SIGNATURES/CONFIDENTIALITY: You and/or your care partner have signed paperwork which will be entered into your electronic medical record.  These signatures attest to the fact that that the information above on your After Visit Summary has been reviewed and is understood.  Full responsibility of the confidentiality of this discharge information lies with you  and/or your care-partner.

## 2023-08-24 NOTE — Op Note (Signed)
[facility] Patient Name: Faith Castillo Procedure Date: 08/24/2023 1:19 PM MRN: [id number] Endoscopist: Particia Lather , , [id number] Age: 47 Referring MD:  Date of Birth: 09/28/1976 Gender: Female Account #: [id number] Procedure:                Colonoscopy Indications:              Iron deficiency anemia Medicines:                Monitored Anesthesia Care Procedure:                Pre-Anesthesia Assessment:                           - Prior to the procedure, a History and Physical                            was performed, and patient medications and                            allergies were reviewed. The patient's tolerance of                            previous anesthesia was also reviewed. The risks                            and benefits of the procedure and the sedation                            options and risks were discussed with the patient.                            All questions were answered, and informed consent                            was obtained. Prior Anticoagulants: The patient has                            taken no anticoagulant or antiplatelet agents. ASA                            Grade Assessment: II - A patient with mild systemic                            disease. After reviewing the risks and benefits,                            the patient was deemed in satisfactory condition to                            undergo the procedure.                           After obtaining informed consent, the colonoscope  was passed under direct vision. Throughout the                            procedure, the patient's blood pressure, pulse, and                            oxygen saturations were monitored continuously. The                            Olympus Scope [id number] was introduced through the                            anus and advanced to the the terminal ileum. The                            colonoscopy was performed without  difficulty. The                            patient tolerated the procedure well. The quality                            of the bowel preparation was adequate. The terminal                            ileum, ileocecal valve, appendiceal orifice, and                            rectum were photographed. Scope In: 1:38:38 PM Scope Out: 2:02:17 PM Scope Withdrawal Time: 0 hours 19 minutes 12 seconds  Total Procedure Duration: 0 hours 23 minutes 39 seconds  Findings:                 The terminal ileum appeared normal.                           A 5 mm polyp was found in the ascending colon. The                            polyp was sessile. The polyp was removed with a                            cold snare. Resection and retrieval were complete.                           Non-bleeding internal hemorrhoids were found during                            retroflexion. Complications:            No immediate complications. Estimated Blood Loss:     Estimated blood loss was minimal. Impression:               - The examined portion of the ileum was normal.                           -  One 5 mm polyp in the ascending colon, removed                            with a cold snare. Resected and retrieved.                           - Non-bleeding internal hemorrhoids. Recommendation:           - Discharge patient to home (with escort).                           - Await pathology results.                           - The findings and recommendations were discussed                            with the patient. Dr Particia Lather "[name]" [place],  08/24/2023 2:08:36 PM

## 2023-08-24 NOTE — Progress Notes (Signed)
Called to room to assist during endoscopic procedure.  Patient ID and intended procedure confirmed with present staff. Received instructions for my participation in the procedure from the performing physician.  

## 2023-08-24 NOTE — Progress Notes (Signed)
Report given to PACU, vss 

## 2023-08-24 NOTE — Progress Notes (Signed)
GASTROENTEROLOGY PROCEDURE H&P NOTE   Primary Care Physician: Pcp, No    Reason for Procedure:   IDA, history of H pylori  Plan:    EGD/colonoscopy  Patient is appropriate for endoscopic procedure(s) in the ambulatory ([facility]) setting.  The nature of the procedure, as well as the risks, benefits, and alternatives were carefully and thoroughly reviewed with the patient. Ample time for discussion and questions allowed. The patient understood, was satisfied, and agreed to proceed.     HPI: Faith Castillo is a 47 y.o. female who presents for EGD/colonoscopy for evaluation of IDA, history of H pylori.  Patient was most recently seen in the Gastroenterology Clinic on 06/22/23.  Patient was admitted and got an EGD on 06/26/23 that showed candida esophagitis, H pylori gastritis, and oozing duodenal ulcers. She completed her H pylori treatment and has been taking Protonix BID. Denies any blood in her stools. She has been avoiding Goody powders. Please refer to that note for full details regarding GI history and clinical presentation.   EGD 06/26/23: - Esophageal plaques were found, consistent with candidiasis. Biopsied. - Gastritis. Biopsied.  - Partially obstructing oozing duodenal ulcers with a visible vessel. NSAID induced etiology. Treatment not successful. Treated with bipolar cautery. Injected. Clip was placed. Clip manufacturer: [employer]. hemostatic spray applied.  - Normal second portion of the duodenum. Path: A. 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  Immunohistochemistry for Helicobacter pylori is positive.  B. ESOPHAGUS, BIOPSY:  Markedly inflamed squamous mucosa with fungus consistent with candidal esophagitis.  Negative for dysplasia or malignancy.    Past Medical History:  Diagnosis Date   Anemia    Blood transfusion without reported diagnosis    Hypertension     Past Surgical History:  Procedure Laterality Date   BIOPSY   06/26/2023   Procedure: BIOPSY;  Surgeon: Napoleon Form, MD;  Location: [facility];  Service: Gastroenterology;;   ESOPHAGOGASTRODUODENOSCOPY (EGD) WITH PROPOFOL N/A 06/26/2023   Procedure: ESOPHAGOGASTRODUODENOSCOPY (EGD) WITH PROPOFOL;  Surgeon: Napoleon Form, MD;  Location: [facility];  Service: Gastroenterology;  Laterality: N/A;   ESOPHAGOGASTRODUODENOSCOPY (EGD) WITH PROPOFOL N/A 06/26/2023   Procedure: ESOPHAGOGASTRODUODENOSCOPY (EGD) WITH PROPOFOL;  Surgeon: Napoleon Form, MD;  Location: [facility];  Service: Gastroenterology;  Laterality: N/A;   HEMOSTASIS CLIP PLACEMENT  06/26/2023   Procedure: HEMOSTASIS CLIP PLACEMENT;  Surgeon: Napoleon Form, MD;  Location: [facility];  Service: Gastroenterology;;   HEMOSTASIS CONTROL  06/26/2023   Procedure: HEMOSTASIS CONTROL;  Surgeon: Napoleon Form, MD;  Location: [facility];  Service: Gastroenterology;;  purastat   HOT HEMOSTASIS N/A 06/26/2023   Procedure: HOT HEMOSTASIS (ARGON PLASMA COAGULATION/BICAP);  Surgeon: Napoleon Form, MD;  Location: [facility] ENDOSCOPY;  Service: Gastroenterology;  Laterality: N/A;   SCLEROTHERAPY  06/26/2023   Procedure: SCLEROTHERAPY;  Surgeon: Napoleon Form, MD;  Location: [facility];  Service: Gastroenterology;;    Prior to Admission medications   Medication Sig Start Date End Date Taking? Authorizing Provider  Multiple Vitamins-Minerals (MULTIVITAMIN ADULT) CHEW Chew 1 tablet by mouth daily.   Yes [provider]  amLODipine (NORVASC) 10 MG tablet Take 1 tablet (10 mg total) by mouth daily. 06/29/23 09/27/23  [place], Alvira Philips, DO  Cyanocobalamin (B-12 PO) Take 1 tablet by mouth daily.    [provider]  ferrous sulfate 325 (65 FE) MG tablet Take 1 tablet (325 mg total) by mouth daily. 06/29/23 09/27/23  [place], Alvira Philips, DO  methocarbamol (ROBAXIN) 500  MG tablet Take 1 tablet (500 mg total) by mouth at bedtime as needed for muscle spasms. Patient taking  differently: Take 500 mg by mouth as needed for muscle spasms. 12/16/21   Merrilee Jansky, MD  ondansetron (ZOFRAN-ODT) 4 MG disintegrating tablet Use 1 tablet every 6 to 8 hours as needed for nausea and vomiting 06/22/23   [place], Amy S, PA-C  pantoprazole (PROTONIX) 40 MG tablet Take 1 tablet (40 mg total) by mouth 2 (two) times daily. 06/28/23 09/26/23  [place], Alvira Philips, DO  sucralfate (CARAFATE) 1 g tablet Take 1 tablet (1 g total) by mouth 4 (four) times daily for 14 days. 06/28/23 07/12/23  [place], Eric J, DO    Current Outpatient Medications  Medication Sig Dispense Refill   Multiple Vitamins-Minerals (MULTIVITAMIN ADULT) CHEW Chew 1 tablet by mouth daily.     amLODipine (NORVASC) 10 MG tablet Take 1 tablet (10 mg total) by mouth daily. 90 tablet 0   Cyanocobalamin (B-12 PO) Take 1 tablet by mouth daily.     ferrous sulfate 325 (65 FE) MG tablet Take 1 tablet (325 mg total) by mouth daily. 90 tablet 0   methocarbamol (ROBAXIN) 500 MG tablet Take 1 tablet (500 mg total) by mouth at bedtime as needed for muscle spasms. (Patient taking differently: Take 500 mg by mouth as needed for muscle spasms.) 20 tablet 0   ondansetron (ZOFRAN-ODT) 4 MG disintegrating tablet Use 1 tablet every 6 to 8 hours as needed for nausea and vomiting 60 tablet 3   pantoprazole (PROTONIX) 40 MG tablet Take 1 tablet (40 mg total) by mouth 2 (two) times daily. 180 tablet 0   sucralfate (CARAFATE) 1 g tablet Take 1 tablet (1 g total) by mouth 4 (four) times daily for 14 days. 56 tablet 0   Current Facility-Administered Medications  Medication Dose Route Frequency Provider Last Rate Last Admin   0.9 %  sodium chloride infusion  500 mL Intravenous Once Imogene Burn, MD        Allergies as of 08/24/2023   (No Known Allergies)    Family History  Problem Relation Age of Onset   Hypertension Mother    Hypertension Father    Colon cancer Neg Hx    Stomach cancer Neg Hx    Esophageal cancer Neg Hx    Colon polyps  Neg Hx    Rectal cancer Neg Hx     Social History   Socioeconomic History   Marital status: Single    Spouse name: Not on file   Number of children: 5   Years of education: Not on file   Highest education level: Not on file  Occupational History   Occupation: CNA  Tobacco Use   Smoking status: Never   Smokeless tobacco: Never  Vaping Use   Vaping status: Never Used  Substance and Sexual Activity   Alcohol use: No   Drug use: No   Sexual activity: Yes  Other Topics Concern   Not on file  Social History Narrative   Not on file   Social Determinants of Health   Financial Resource Strain: Not on file  Food Insecurity: No Food Insecurity (06/25/2023)   Hunger Vital Sign    Worried About Running Out of Food in the Last Year: Never true    Ran Out of Food in the Last Year: Never true  Transportation Needs: No Transportation Needs (06/25/2023)   PRAPARE - Administrator, Civil Service (Medical): No  Lack of Transportation (Non-Medical): No  Physical Activity: Not on file  Stress: Not on file  Social Connections: Not on file  Intimate Partner Violence: Not At Risk (06/25/2023)   Humiliation, Afraid, Rape, and Kick questionnaire    Fear of Current or Ex-Partner: No    Emotionally Abused: No    Physically Abused: No    Sexually Abused: No    Physical Exam: Vital signs in last 24 hours: BP (!) 154/100   Pulse 67   Temp 97.9 F (36.6 C)   Ht 5\' 6"  (1.676 m)   Wt 136 lb (61.7 kg)   LMP 08/15/2023   SpO2 100%   BMI 21.95 kg/m  GEN: NAD EYE: Sclerae anicteric ENT: MMM CV: Non-tachycardic Pulm: No increased WOB GI: Soft NEURO:  Alert & Oriented   Eulah Pont, MD [facility]   08/24/2023 12:39 PM

## 2023-08-27 ENCOUNTER — Encounter: Payer: BC Managed Care – PPO | Admitting: Gastroenterology

## 2023-08-27 ENCOUNTER — Telehealth: Payer: Self-pay

## 2023-08-27 NOTE — Telephone Encounter (Signed)
  Follow up Call-     08/24/2023   12:27 PM  Call back number  Post procedure Call Back phone  # [phone]  Permission to leave phone message Yes     Patient questions:  Do you have a fever, pain , or abdominal swelling? No. Pain Score  0 *  Have you tolerated food without any problems? Yes.    Have you been able to return to your normal activities? Yes.    Do you have any questions about your discharge instructions: Diet   No. Medications  No. Follow up visit  No.  Do you have questions or concerns about your Care? No.  Actions: * If pain score is 4 or above: No action needed, pain <4.

## 2023-08-30 ENCOUNTER — Encounter: Payer: Self-pay | Admitting: Internal Medicine

## 2023-08-30 LAB — SURGICAL PATHOLOGY

## 2023-08-30 NOTE — Progress Notes (Signed)
Hi Beth, please let the patient know that gastric biopsies pathology came back positive for H pylori gastritis. It appears to her prior Pylera therapy did not work. Recommend bismuth quadruple therapy for treatment: - Levofloxacin 500 mg every day x 14 days - Amoxicillin 750 mg TID x 14 days - PPI BID x 14 days  I will discuss how to test for eradication during her follow up appointment with me.

## 2023-09-03 ENCOUNTER — Other Ambulatory Visit: Payer: Self-pay

## 2023-09-03 MED ORDER — AMOXICILLIN 250 MG PO CAPS: 750.0000 mg | ORAL_CAPSULE | Freq: Three times a day (TID) | ORAL | 0 refills | Status: AC

## 2023-09-03 MED ORDER — LEVOFLOXACIN 500 MG PO TABS
500.0000 mg | ORAL_TABLET | Freq: Every day | ORAL | 0 refills | Status: AC
Start: 2023-09-03 — End: 2023-09-17

## 2023-09-18 ENCOUNTER — Other Ambulatory Visit (INDEPENDENT_AMBULATORY_CARE_PROVIDER_SITE_OTHER): Payer: BC Managed Care – PPO

## 2023-09-18 ENCOUNTER — Ambulatory Visit: Payer: BC Managed Care – PPO | Admitting: Internal Medicine

## 2023-09-18 ENCOUNTER — Encounter: Payer: Self-pay | Admitting: Internal Medicine

## 2023-09-18 VITALS — BP 130/80 | HR 79 | Ht 66.0 in | Wt 168.0 lb

## 2023-09-18 DIAGNOSIS — A048 Other specified bacterial intestinal infections: Secondary | ICD-10-CM

## 2023-09-18 DIAGNOSIS — D509 Iron deficiency anemia, unspecified: Secondary | ICD-10-CM | POA: Diagnosis not present

## 2023-09-18 DIAGNOSIS — N920 Excessive and frequent menstruation with regular cycle: Secondary | ICD-10-CM

## 2023-09-18 DIAGNOSIS — B9681 Helicobacter pylori [H. pylori] as the cause of diseases classified elsewhere: Secondary | ICD-10-CM

## 2023-09-18 LAB — IBC + FERRITIN
Ferritin: 4.3 ng/mL — ABNORMAL LOW (ref 10.0–291.0)
Iron: 25 ug/dL — ABNORMAL LOW (ref 42–145)
Saturation Ratios: 6.4 % — ABNORMAL LOW (ref 20.0–50.0)
TIBC: 393.4 ug/dL (ref 250.0–450.0)
Transferrin: 281 mg/dL (ref 212.0–360.0)

## 2023-09-18 LAB — CBC
HCT: 32.4 % — ABNORMAL LOW (ref 36.0–46.0)
Hemoglobin: 10.6 g/dL — ABNORMAL LOW (ref 12.0–15.0)
MCHC: 32.7 g/dL (ref 30.0–36.0)
MCV: 83 fl (ref 78.0–100.0)
Platelets: 321 K/uL (ref 150.0–400.0)
RBC: 3.91 Mil/uL (ref 3.87–5.11)
RDW: 16.2 % — ABNORMAL HIGH (ref 11.5–15.5)
WBC: 5.6 K/uL (ref 4.0–10.5)

## 2023-09-18 MED ORDER — PANTOPRAZOLE SODIUM 40 MG PO TBEC: 40.0000 mg | DELAYED_RELEASE_TABLET | Freq: Every day | ORAL | 0 refills | Status: AC

## 2023-09-18 NOTE — Progress Notes (Signed)
Chief Complaint: IDA  HPI: Patient is a 47 year old female patient with past medical history significant for hypertension and iron deficiency anemia in setting of menorrhagia-requiring hospitalization in 2022 for blood transfusion/iron infusion who presents to clinic for follow-up after recent hospitalization on 06/25/2023 for symptomatic anemia with Hgb 6.3.  04/15/2023 ED visit patient presented with complaints of nausea, vomiting, and abdominal pain.  Patient reported that the symptoms had been ongoing for the past 2-3 years.  She also reported weight loss > 210lbs now down to 130lbs. Hgb found to be 7.3, but patient reported noncompliance with iron supplementation at that time. Patient was given a unit of PRBCs, 2g of Rocephin, and discharged with Keflex, zofran, and oral iron pills. Recommended to follow-up with PCP.  06/22/2023  Patient followed up after recent ED visit (6/30) in the Gastroenterology office with Amy, PA. Etiology of the nausea, vomiting, and weight loss was not clear at that time, however patient had admitted to heavy use of BC powders and Goody powders. During office visit, patient was prescribed oral omeprazole 40mg  QD, oral ferrous sulfate 325mg  1 tab BID, Zofran prn, and scheduled for outpatient EGD/colon. Advised to stop all NSAID use. Lab work was ordered which revealed a hgb of 6.3, ferritin 2.4, iron 10, and iron saturation 3.  She was advised by our office to go to the emergency room for severe anemia requiring blood transfusion and iron infusion, at that time patient reported no symptoms and reported she was suppose to work all weekend.   06/25/2023- Patient presents to ED and admitted to the hospital for Hgb 6.3 (9/6) with complaints of SOB and weakness.  At that time she had reported her last menstrual period started on 9/2 and was ongoing. It was unclear if she had been taking iron supplementation as prescribed. ED rechecked her Hgb and was 5.8. MCV 68.  Patient received 2  units of PRBCs.  Patient scheduled for EGD with Dr.Nandigam on 06/26/2023 which showed candida esophagitis, H pylori gastritis, and oozing duodenal ulcers.  Patient was started on PPI therapy 40 mg p.o. twice daily, Carafate 1 g 4 times a day for 2 weeks, and Diflucan 200 mg daily for 21 days.Treated with IV iron x 2. Patient was prescribed Pylera prescription outpatient.  08/24/2023 patient presents to [facility] at the gastroenterology clinic for follow-up EGD and colonoscopy. At that time patient reported she had completed her H. pylori treatment and had been taking Protonix twice daily. The gastric biopsies came back positive for H. pylori gastritis indicating the prior Pylera therapy did not work. Patient was prescribed 14 days of levofloxacin and amoxicillin.  Interval history: Patient returns for follow-up today stating she is feeling much better. She is currently not experiencing any GI symptoms.She reports one regular brown bowel movement every day. She has not been taking the Ferrous sulfate 1 tab BID as prescribed due to it tasting bad.  She has continued the Pantoprazole 40mg  1 tab BID. She has continued to refrain from using  all NSAIDs. She completed the levofloxacin, but is still taking the amoxicillin for H. Pylori. She reports she still has few days left of amoxicillin, it has been difficult to take TID with her work scheduled working night shift. She reports her appetite has improved. Per records she has gained 30lbs over the past 4 weeks. Patient also with history of menorrhagia, last menstrual cycle was beginning of October and lasted 7 days, moderate flow. Patient has not followed up with GYN. She  reports she wanted to see how she did the next few months after new medications before scheduling appointment. Patient denies altered bowel habits or blood in stool. Patient denies hematemesis. Patient denies GERD or dysphagia. Patient denies nausea or vomiting, or weight loss. Patient denies CP, SOB,  dizziness.  Wt Readings from Last 3 Encounters:  09/18/23 168 lb (76.2 kg)  08/24/23 136 lb (61.7 kg)  06/28/23 138 lb 7.2 oz (62.8 kg)    Past Medical History:  Diagnosis Date   Anemia    Blood transfusion without reported diagnosis    Hypertension    Past Surgical History:  Procedure Laterality Date   BIOPSY  06/26/2023   Procedure: BIOPSY;  Surgeon: Napoleon Form, MD;  Location: [facility];  Service: Gastroenterology;;   ESOPHAGOGASTRODUODENOSCOPY (EGD) WITH PROPOFOL N/A 06/26/2023   Procedure: ESOPHAGOGASTRODUODENOSCOPY (EGD) WITH PROPOFOL;  Surgeon: Napoleon Form, MD;  Location: [facility]OS[facility];  Service: Gastroenterology;  Laterality: N/A;   ESOPHAGOGASTRODUODENOSCOPY (EGD) WITH PROPOFOL N/A 06/26/2023   Procedure: ESOPHAGOGASTRODUODENOSCOPY (EGD) WITH PROPOFOL;  Surgeon: Napoleon Form, MD;  Location: [facility];  Service: Gastroenterology;  Laterality: N/A;   HEMOSTASIS CLIP PLACEMENT  06/26/2023   Procedure: HEMOSTASIS CLIP PLACEMENT;  Surgeon: Napoleon Form, MD;  Location: [facility];  Service: Gastroenterology;;   HEMOSTASIS CONTROL  06/26/2023   Procedure: HEMOSTASIS CONTROL;  Surgeon: Napoleon Form, MD;  Location: [facility];  Service: Gastroenterology;;  purastat   HOT HEMOSTASIS N/A 06/26/2023   Procedure: HOT HEMOSTASIS (ARGON PLASMA COAGULATION/BICAP);  Surgeon: Napoleon Form, MD;  [facility] END[facility]COPY;  Service: Gastroenterology;  Laterality: N/A;   SCLEROTHERAPY  06/26/2023   Procedure: SCLEROTHERAPY;  Surgeon: Napoleon Form, MD;  Location: [facility] ENDOSCOPY;  Service: Gastroenterology;;   Current Outpatient Medications  Medication Sig Dispense Refill   Cyanocobalamin (B-12 PO) Take 1 tablet by mouth daily.     ferrous sulfate 325 (65 FE) MG tablet Take 1 tablet (325 mg total) by mouth daily. 90 tablet 0   methocarbamol (ROBAXIN) 500 MG tablet Take 1 tablet (500 mg total) by mouth at bedtime as needed for muscle spasms. (Patient  taking differently: Take 500 mg by mouth as needed for muscle spasms.) 20 tablet 0   Multiple Vitamins-Minerals (MULTIVITAMIN ADULT) CHEW Chew 1 tablet by mouth daily.     ondansetron (ZOFRAN-ODT) 4 MG disintegrating tablet Use 1 tablet every 6 to 8 hours as needed for nausea and vomiting 60 tablet 3   pantoprazole (PROTONIX) 40 MG tablet Take 1 tablet (40 mg total) by mouth 2 (two) times daily. 180 tablet 0   amLODipine (NORVASC) 10 MG tablet Take 1 tablet (10 mg total) by mouth daily. (Patient not taking: Reported on 09/18/2023) 90 tablet 0   sucralfate (CARAFATE) 1 g tablet Take 1 tablet (1 g total) by mouth 4 (four) times daily for 14 days. 56 tablet 0   No current facility-administered medications for this visit.   Allergies as of 09/18/2023   (No Known Allergies)   Family History  Problem Relation Age of Onset   Hypertension Mother    Hypertension Father    Colon cancer Neg Hx    Stomach cancer Neg Hx    Esophageal cancer Neg Hx    Colon polyps Neg Hx    Rectal cancer Neg Hx    Review of Systems:    Constitutional: No weight loss, fever, chills, weakness or fatigue HEENT: Eyes: No change in vision  Ears, Nose, Throat:  No change in hearing or congestion Skin: No rash or itching Cardiovascular: No chest pain, chest pressure or palpitations   Respiratory: No SOB or cough Gastrointestinal: See HPI and otherwise negative Genitourinary: No dysuria or change in urinary frequency Neurological: No headache, dizziness or syncope Musculoskeletal: No new muscle or joint pain Hematologic: No bleeding or bruising Psychiatric: No history of depression or anxiety   Physical Exam:  Vital signs: BP 130/80   Pulse 79   Ht 5\' 6"  (1.676 m)   Wt 168 lb (76.2 kg)   LMP 08/15/2023   BMI 27.12 kg/m  Constitutional:   Pleasant African American female appears to be in NAD, Well developed, Well nourished, alert and cooperative Throat: Oral cavity and pharynx without inflammation,  swelling or lesion.  Respiratory: Respirations even and unlabored. Lungs clear to auscultation bilaterally.   No wheezes, crackles, or rhonchi.  Cardiovascular: Normal S1, S2. Regular rate and rhythm. No peripheral edema, cyanosis or pallor.  Gastrointestinal:  Soft, nondistended, nontender. No rebound or guarding. Normal bowel sounds. No appreciable masses or hepatomegaly. Rectal:  Not performed.   RELEVANT LABS AND IMAGING: CBC    Latest Ref Rng & Units 06/28/2023   12:16 PM 06/28/2023    4:20 AM 06/27/2023    9:33 PM  CBC  WBC 4.0 - 10.5 K/uL  9.7    Hemoglobin 12.0 - 15.0 g/dL 8.6  7.6  7.9   Hematocrit 36.0 - 46.0 % 30.1  27.0  28.1   Platelets 150 - 400 K/uL  452      IRON PANEL Lab Results  Component Value Date   IRON 14 (L) 06/25/2023   TIBC 442 06/25/2023   FERRITIN 3 (L) 06/25/2023    Last vitamin B12 and Folate Lab Results  Component Value Date   VITAMINB12 682 06/25/2023   FOLATE 19.2 06/25/2023    CMP     Component Value Date/Time   NA 138 06/26/2023 0439   K 3.8 06/26/2023 0439   CL 108 06/26/2023 0439   CO2 22 06/26/2023 0439   GLUCOSE 89 06/26/2023 0439   BUN 5 (L) 06/26/2023 0439   CREATININE 0.61 06/26/2023 0439   CALCIUM 8.6 (L) 06/26/2023 0439   PROT 6.9 06/26/2023 0439   ALBUMIN 3.2 (L) 06/26/2023 0439   AST 15 06/26/2023 0439   ALT 10 06/26/2023 0439   ALKPHOS 45 06/26/2023 0439   BILITOT 1.0 06/26/2023 0439   GFRNONAA >60 06/26/2023 0439   GFRAA  01/30/2009 0532    >60        The eGFR has been calculated using the MDRD equation. This calculation has not been validated in all clinical situations. eGFR's persistently <60 mL/min signify possible Chronic Kidney Disease.   06/25/2023 US pelvis IMPRESSION: 1. The uterus and endometrium are unremarkable. 2. The ovaries are not visualized. 3. No free fluid in the pelvis. 4. Endometrial thickness is 5.5 mm. If bleeding remains unresponsive to hormonal or medical therapy, sonohysterogram  should be considered for focal lesion work-up  06/26/2023 EGD Impression:  - Esophageal plaques were found, consistent with candidiasis. Biopsied.  - Gastritis. Biopsied. - Partially obstructing oozing duodenal ulcers with a visible vessel. NSAID induced etiology. Treatment not successful. Treated with bipolar cautery. Injected. Clip was placed. Clip manufacturer: [employer]. hemostatic spray applied.  - Normal second portion of the duodenum. - LA Grade D candidiasis esophagitis with no bleeding.  - No specimens collected. Path: FINAL MICROSCOPIC DIAGNOSIS:  A. 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  Immunohistochemistry for Helicobacter pylori is positive.  B. ESOPHAGUS, BIOPSY:  Markedly inflamed squamous mucosa with fungus consistent with candidal  esophagitis.  Negative for dysplasia or malignancy.   08/24/2023 EGD Impression:  - Normal esophagus.  - Gastritis. Biopsied.  - Duodenitis.  08/24/2023 colonoscopy Impression:  - The examined portion of the ileum was normal.  - One 5 mm polyp in the ascending colon, removed with a cold snare. Resected and retrieved.  - Non- bleeding internal hemorrhoids. Path:  FINAL DIAGNOSIS       1. Surgical [P], random gastric sites :       - H.PYLORI GASTRITIS       - POSITIVE FOR H. PYLORI ON IMMUNOHISTOCHEMICAL STAIN       - NEGATIVE FOR INTESTINAL METAPLASIA, DYSPLASIA OR MALIGNANCY       2. Surgical [P], colon, ascending, polyp (1) :       - TUBULAR ADENOMA.       - NO HIGH GRADE DYSPLASIA OR MALIGNANCY.   Assessment: 47 year old female patient presents for follow-up on IDA and H. Pylori. She has not completed the H.pylori treatment, however took most of it along with Protonix BID and reports she is doing very well. Denies blood in stools. She has been avoiding NSAID's. We will check for H. Pylori eradication and recheck her labs for anemia. If her iron is low and she cannot tolerate oral  Iron with applesauce we will consider Hematology referral for IV Iron.  Encounter Diagnoses  Name Primary?   Iron deficiency anemia, unspecified iron deficiency anemia type Yes   H. pylori infection    Menorrhagia with regular cycle     Plan: -Continue Pantoprazole 40mg  BID down to QD -Recommend Ferrous sulfate 1 tablet BID, take with applesauce. - Order Diatherix H pylori eradication test -Recheck CBC, Iron panel, ferritin, TIBC -Continue to avoid all NSAID use -Recommend follow-up outpatient with GYN  Tresean Mattix, FNP-[facility] 09/18/2023, 2:01 PM  I have seen the patient with Ms. Susen Haskew and she has served as a Neurosurgeon.  Eulah Pont, MD  I spent 45 minutes of time, including in depth chart review, independent review of results as outlined above, communicating results with the patient directly, face-to-face time with the patient, coordinating care, ordering studies and medications as appropriate, and documentation.

## 2023-09-18 NOTE — Patient Instructions (Addendum)
Your provider has requested that you go to the basement level for lab work before leaving today. Press "B" on the elevator. The lab is located at the first door on the left as you exit the elevator.  Your provider has ordered "Diatherix" stool testing for you. You have received a kit from our office today containing all necessary supplies to complete this test. Please carefully read the stool collection instructions provided in the kit before opening the accompanying materials. In addition, be sure to place the label from the top right corner of the laboratory request sheet onto the "puritan opti-swab" tube that is supplied in the kit. This label should include your full name and date of birth. After completing the test, you should secure the purtian tube into the specimen biohazard bag. The laboratory request information sheet (including date and time of specimen collection) should be placed into the outside pocket of the specimen biohazard bag and returned to the [place] lab with 2 days of collection.   If the laboratory information sheet specimen date and time are not filled out, the test will NOT be performed.  We have sent the following medications to your pharmacy for you to pick up at your convenience: Pantoprazole   Please take iron supplements with applesauce  _______________________________________________________  If your blood pressure at your visit was 140/90 or greater, please contact your primary care physician to follow up on this.  _______________________________________________________  If you are age 57 or older, your body mass index should be between 23-30. Your Body mass index is 27.12 kg/m. If this is out of the aforementioned range listed, please consider follow up with your Primary Care Provider.  If you are age 28 or younger, your body mass index should be between 19-25. Your Body mass index is 27.12 kg/m. If this is out of the aformentioned range listed, please consider  follow up with your Primary Care Provider.   ________________________________________________________  The [facility] providers would like to encourage you to use [facility] to communicate with providers for non-urgent requests or questions.  Due to long hold times on the telephone, sending your provider a message by [facility] may be a faster and more efficient way to get a response.  Please allow 48 business hours for a response.  Please remember that this is for non-urgent requests.  _______________________________________________________   Due to recent changes in healthcare laws, you may see the results of your imaging and laboratory studies on MyChart before your provider has had a chance to review them.  We understand that in some cases there may be results that are confusing or concerning to you. Not all laboratory results come back in the same time frame and the provider may be waiting for multiple results in order to interpret others.  Please give [place] 48 hours in order for your provider to thoroughly review all the results before contacting the office for clarification of your results.    Thank you for entrusting me with your care and for choosing [facility], Dr. Eulah Pont

## 2023-09-24 DIAGNOSIS — A048 Other specified bacterial intestinal infections: Secondary | ICD-10-CM | POA: Diagnosis not present

## 2023-09-24 DIAGNOSIS — N920 Excessive and frequent menstruation with regular cycle: Secondary | ICD-10-CM | POA: Diagnosis not present

## 2023-09-27 ENCOUNTER — Encounter: Payer: Self-pay | Admitting: Internal Medicine

## 2023-09-27 NOTE — Progress Notes (Signed)
Received the results of the patient's Diatherix H. pylori test 09/24/2023 which was negative.  This suggests that her infection has been adequately treated.  Will have this report scanned into the system.  Tisha, please contact the patient and let her know that her H. pylori stool test was negative.

## 2023-10-01 ENCOUNTER — Telehealth: Payer: Self-pay

## 2023-10-01 NOTE — Telephone Encounter (Signed)
Spoke to patient advised per provider Diatherix H.pylori stool test was negative. Patient verbalized understanding
